# Patient Record
Sex: Female | Born: 1990 | Race: Black or African American | Hispanic: No | Marital: Single | Smoking: Never smoker
Health system: Southern US, Community
[De-identification: ages and names within clinical notes are randomized; demographics above are authoritative.]

## PROBLEM LIST (undated history)

## (undated) DIAGNOSIS — G43909 Migraine, unspecified, not intractable, without status migrainosus: Secondary | ICD-10-CM

## (undated) DIAGNOSIS — Z789 Other specified health status: Secondary | ICD-10-CM

## (undated) DIAGNOSIS — O139 Gestational [pregnancy-induced] hypertension without significant proteinuria, unspecified trimester: Secondary | ICD-10-CM

## (undated) HISTORY — DX: Migraine, unspecified, not intractable, without status migrainosus: G43.909

## (undated) HISTORY — DX: Gestational (pregnancy-induced) hypertension without significant proteinuria, unspecified trimester: O13.9

## (undated) HISTORY — PX: NO PAST SURGERIES: SHX2092

---

## 2014-02-11 ENCOUNTER — Encounter (INDEPENDENT_AMBULATORY_CARE_PROVIDER_SITE_OTHER): Payer: Medicaid Other | Admitting: Family Medicine

## 2014-02-11 NOTE — Progress Notes (Deleted)
Subjective:       Patient ID: Kaylee Lin is a 23 y.o. female.    HPI    {Common ambulatory SmartLinks:19316}    Review of Systems        Objective:    Physical Exam        Assessment:       ***      Plan:      Procedures    ***

## 2014-02-17 NOTE — Progress Notes (Signed)
PATIENT NO SHOW.

## 2014-11-19 ENCOUNTER — Encounter (INDEPENDENT_AMBULATORY_CARE_PROVIDER_SITE_OTHER): Payer: Self-pay | Admitting: Internal Medicine

## 2014-11-19 ENCOUNTER — Ambulatory Visit (INDEPENDENT_AMBULATORY_CARE_PROVIDER_SITE_OTHER): Payer: Medicaid Other | Admitting: Internal Medicine

## 2014-11-19 VITALS — BP 110/73 | HR 69 | Temp 98.2°F | Resp 16 | Ht 63.0 in | Wt 122.0 lb

## 2014-11-19 DIAGNOSIS — G43809 Other migraine, not intractable, without status migrainosus: Secondary | ICD-10-CM

## 2014-11-19 MED ORDER — KETOROLAC TROMETHAMINE 30 MG/ML IJ SOLN
30.0000 mg | Freq: Once | INTRAMUSCULAR | Status: AC
Start: 2014-11-19 — End: 2014-11-19
  Administered 2014-11-19: 30 mg via INTRAMUSCULAR

## 2014-11-19 MED ORDER — SUMATRIPTAN SUCCINATE 50 MG PO TABS
50.0000 mg | ORAL_TABLET | ORAL | Status: AC | PRN
Start: 2014-11-19 — End: ?

## 2014-11-19 NOTE — Patient Instructions (Signed)
Migraine Headache  Migraine headaches are caused by changes in blood flow to the brain. This causes throbbing or constant pain on one or both sides of your head. The pain may last from a few hours to several days. You usually will have nausea, vomiting, sensitivity to light and sound, and blurred vision. A migraine may be triggered by emotional stressor depression, or by hormone changes during the menstrual cycle. Other triggers include birth control pills, overuse of migraine medicines, alcohol or caffeine, foods with tyramine, eye strain, weather changes, missed meals,or too little or too much sleep.  Home care  Follow these tips when taking care of yourself at home:   Don't drive yourself home if you were given pain medicine for your headache. Instead, have someone else drive you home. Try to sleep when you get home. You should feel much better when you wake up.   Cold can help ease migraine symptoms. Put an ice pack on your forehead or at the base of your skull. Put heat on the back of your neck to help ease any neck spasm.   Drink only clear liquids or eat a light diet until your symptoms get better. This will help you avoid nausea and vomiting.  How to prevent migraines  Pay attention to what seems to trigger your headache. Try to avoid the triggers when you can. If you have frequent headaches, consider keeping a headache diary. In it, write down what you were doing, feeling, or eating in the hours before each headache. Show this to your health care provider to help find the cause of your headaches.  If stress seems to be a trigger for your headaches, figure out what is causing stress in your life. Learn new ways to handle your stress. Ideas include regular exercise, biofeedback, self-hypnosis, and meditation. Talk with your health care provider to find out more information about managing stress. Many books and digital media are also available on this subject.  Tyramine is a substance found in many  foods. It can trigger a migraine in some people. These foods contain tyramine:   Chocolate   Yogurt   All cheeses but cottage cheese and cream cheese   Smoked or pickled fish and meat, including herring, caviar, bologna, pepperoni, and salami   Liver   Avocados   Bananas   Figs   Raisins   Red wine  Try staying away from these foods for 1 to 2 months to see if you have fewer headaches.  How to treat future headaches   Take time out at the first sign of a headache, if possible. Find a quiet, dark, comfortable place to sit or lie down. Let yourself relax or sleep.   Put an ice pack on your forehead or on the area of greatest pain. A heating pad and massage may help if you are having a muscle spasm and tightness in your neck.   If you have been prescribed a medicine to stop a migraine headache, use this at the first warning sign of the headache for best results. First signs may be an aura or pain.   If you need to take medicine often for your migraine, talk with your health care provider about other ways to prevent your headaches.  Follow-up care  Follow up with your health care provider if your headache doesn't get better within the next 24 hours. Talk with your provider if you have frequent headaches. He or she can figure out a treatment plan. Ask if   you can have medicine to take at home the next time you get a bad headache. This may keep you from having to visit the emergency department in the future. You may need to see a headache specialist (neurologist) if you continue to have headaches.  When to seek medical advice  Call your health care provider right awayif any of these occur:   Your head pain gets worse, or doesn't get better within 24 hours   You can't keep liquids down (repeated vomiting)   Pain in your sinuses, ears, or throat   Fever of 100.4 F (38 C) or higher, or as directed by your health care provider   Stiff neck   Extreme drowsiness, confusion, or fainting   Dizziness, or  dizziness with spinning sensation (vertigo)   Weakness in an arm or leg, or on one side of your face   Difficulty talking or seeing     2000-2015 [employer] [address], [place] [address]. All rights reserved. This information is not intended as a substitute for professional medical care. Always follow your healthcare professional's instructions.

## 2014-11-19 NOTE — Progress Notes (Signed)
Subjective:       Patient ID: Kaylee Lin is a 24 y.o. female.    HPI    24 year old female presents with migraine headaches x 3 days.  The patient states that she has nausea, photophobia, and pounding pain in her head.  The patient has been worked up extensively by neurology, with no significant findings.  The patient has never taken imitrex in the past.  She has taken a caffeine pill with no improvement of her symptoms.  She feels that she needs pain medications to quell her symptoms.     Review of Systems        Objective:     Physical Exam   Nursing note and vitals reviewed.  Constitutional: She appears well-developed.   HENT:   Right Ear: External ear normal.   Left Ear: External ear normal.   Mouth/Throat: Oropharynx is clear and moist. No oropharyngeal exudate.   Eyes: Conjunctivae and EOM are normal. Right eye exhibits no discharge. Left eye exhibits no discharge.   Neck: Normal range of motion.   Cardiovascular: Normal rate and regular rhythm.    No murmur heard.  Pulmonary/Chest: Effort normal and breath sounds normal. She has no wheezes.           Assessment:       Patient with migraines       Plan:       The patient was given an injection of toradol, which she states helped a little.  The patient will be sent home with imitrex for the headaches.  She is to follow up with her neurologist.

## 2016-06-01 ENCOUNTER — Encounter (HOSPITAL_COMMUNITY): Payer: Self-pay | Admitting: Emergency Medicine

## 2016-06-01 ENCOUNTER — Ambulatory Visit (HOSPITAL_COMMUNITY)
Admission: EM | Admit: 2016-06-01 | Discharge: 2016-06-01 | Disposition: A | Discharge status: Home / Self Care | Payer: Medicaid Other | Attending: Emergency Medicine | Admitting: Emergency Medicine

## 2016-06-01 DIAGNOSIS — H9201 Otalgia, right ear: Secondary | ICD-10-CM | POA: Diagnosis not present

## 2016-06-01 DIAGNOSIS — H6981 Other specified disorders of Eustachian tube, right ear: Secondary | ICD-10-CM | POA: Diagnosis not present

## 2016-06-01 DIAGNOSIS — J00 Acute nasopharyngitis [common cold]: Secondary | ICD-10-CM

## 2016-06-01 MED ORDER — IPRATROPIUM BROMIDE 0.06 % NA SOLN: 2.0000 | Freq: Four times a day (QID) | NASAL | 0 refills | Status: DC

## 2016-06-01 MED ORDER — NAPROXEN 500 MG PO TABS: 500.0000 mg | ORAL_TABLET | Freq: Two times a day (BID) | ORAL | 0 refills | Status: DC

## 2016-06-01 MED ORDER — AMOXICILLIN 875 MG PO TABS: 875.0000 mg | ORAL_TABLET | Freq: Two times a day (BID) | ORAL | 0 refills | Status: DC

## 2016-06-01 NOTE — ED Triage Notes (Signed)
[facility] a [facility] of right side ear and face pain x 2 weeks. The patient stated that she believed that she was cutting a wisdom tooth.

## 2016-06-01 NOTE — ED Provider Notes (Signed)
CSN: [id number]     Arrival date & time 06/01/16  1723 History   First MD Initiated Contact with Patient 06/01/16 1832     Chief Complaint  Patient presents with  . Otalgia   (Consider location/radiation/quality/duration/timing/severity/associated sxs/prior Treatment) Patient c/o right ear and face discomfort for last few weeks.  Patient has been having URI sx's and sore throat and sinus congestion.   The history is provided by the patient.  Otalgia  Location:  Right Behind ear:  No abnormality Quality:  Aching Severity:  Moderate Onset quality:  Gradual Duration:  2 weeks Timing:  Constant Progression:  Worsening Chronicity:  New Relieved by:  None tried Worsened by:  Nothing Associated symptoms: congestion, rhinorrhea and sore throat     History reviewed. No pertinent past medical history. History reviewed. No pertinent surgical history. History reviewed. No pertinent family history. Social History  Substance Use Topics  . Smoking status: Current Some Day Smoker    Types: Cigarettes  . Smokeless tobacco: Never Used  . Alcohol use No   OB History    No data available     Review of Systems  Constitutional: Positive for fatigue.  HENT: Positive for congestion, ear pain, rhinorrhea and sore throat.   Eyes: Negative.   Respiratory: Negative.   Cardiovascular: Negative.   Gastrointestinal: Negative.   Endocrine: Negative.   Genitourinary: Negative.   Musculoskeletal: Negative.   Allergic/Immunologic: Negative.   Neurological: Negative.   Hematological: Negative.   Psychiatric/Behavioral: Negative.     Allergies  Codeine  Home Medications   Prior to Admission medications   Medication Sig Start Date End Date Taking? Authorizing Provider  amoxicillin (AMOXIL) 875 MG tablet Take 1 tablet (875 mg total) by mouth 2 (two) times daily. 06/01/16   Deatra CanterWilliam J Tashala Cumbo, FNP  ipratropium (ATROVENT) 0.06 % nasal spray Place 2 sprays into both nostrils 4 (four) times daily.  06/01/16   Deatra CanterWilliam J Elford Evilsizer, FNP  naproxen (NAPROSYN) 500 MG tablet Take 1 tablet (500 mg total) by mouth 2 (two) times daily with a meal. 06/01/16   Deatra CanterWilliam J Dayle Mcnerney, FNP   Meds Ordered and Administered this Visit  Medications - No data to display  BP 110/82 (BP Location: Left Arm)   Pulse 69   Temp 98.1 F (36.7 C) (Oral)   Resp 16   LMP 06/01/2016 (Exact Date)   SpO2 100%  No data found.   Physical Exam  Constitutional: She appears well-developed and well-nourished.  HENT:  Head: Normocephalic and atraumatic.  Right Ear: External ear normal.  Left Ear: External ear normal.  Mouth/Throat: Oropharynx is clear and moist.  Eyes: Conjunctivae and EOM are normal. Pupils are equal, round, and reactive to light.  Neck: Normal range of motion. Neck supple.  Cardiovascular: Normal rate, regular rhythm and normal heart sounds.   Pulmonary/Chest: Effort normal and breath sounds normal.  Abdominal: Soft.  Nursing note and vitals reviewed.   Urgent Care Course   Clinical Course     Procedures (including critical care time)  Labs Review Labs Reviewed - No data to display  Imaging Review No results found.   Visual Acuity Review  Right Eye Distance:   Left Eye Distance:   Bilateral Distance:    Right Eye Near:   Left Eye Near:    Bilateral Near:         MDM   1. Acute nasopharyngitis   2. Otalgia of right ear   3. ETD (Eustachian tube dysfunction), right  Ipratropium nasal spray Amoxicillin 875mg  one po bid x 7 days  Naprosyn 500mg one po bid #14    Deatra Canter, FNP 06/01/16 1858    Deatra Canter, FNP 06/01/16 1858

## 2017-02-23 ENCOUNTER — Encounter: Payer: Self-pay | Admitting: Obstetrics & Gynecology

## 2017-03-27 ENCOUNTER — Emergency Department (HOSPITAL_COMMUNITY)
Admission: EM | Admit: 2017-03-27 | Discharge: 2017-03-27 | Disposition: A | Discharge status: Home / Self Care | Payer: No Typology Code available for payment source | Attending: Emergency Medicine | Admitting: Emergency Medicine

## 2017-03-27 ENCOUNTER — Encounter (HOSPITAL_COMMUNITY): Payer: Self-pay | Admitting: Emergency Medicine

## 2017-03-27 ENCOUNTER — Emergency Department (HOSPITAL_COMMUNITY): Payer: No Typology Code available for payment source

## 2017-03-27 DIAGNOSIS — S76211A Strain of adductor muscle, fascia and tendon of right thigh, initial encounter: Secondary | ICD-10-CM | POA: Insufficient documentation

## 2017-03-27 DIAGNOSIS — F17210 Nicotine dependence, cigarettes, uncomplicated: Secondary | ICD-10-CM | POA: Diagnosis not present

## 2017-03-27 DIAGNOSIS — S29012A Strain of muscle and tendon of back wall of thorax, initial encounter: Secondary | ICD-10-CM | POA: Diagnosis not present

## 2017-03-27 DIAGNOSIS — Y9389 Activity, other specified: Secondary | ICD-10-CM | POA: Insufficient documentation

## 2017-03-27 DIAGNOSIS — S46819A Strain of other muscles, fascia and tendons at shoulder and upper arm level, unspecified arm, initial encounter: Secondary | ICD-10-CM

## 2017-03-27 DIAGNOSIS — Z79899 Other long term (current) drug therapy: Secondary | ICD-10-CM | POA: Diagnosis not present

## 2017-03-27 DIAGNOSIS — S39012A Strain of muscle, fascia and tendon of lower back, initial encounter: Secondary | ICD-10-CM

## 2017-03-27 DIAGNOSIS — Y999 Unspecified external cause status: Secondary | ICD-10-CM | POA: Diagnosis not present

## 2017-03-27 DIAGNOSIS — S1980XA Other specified injuries of unspecified part of neck, initial encounter: Secondary | ICD-10-CM | POA: Diagnosis present

## 2017-03-27 DIAGNOSIS — Y92410 Unspecified street and highway as the place of occurrence of the external cause: Secondary | ICD-10-CM | POA: Insufficient documentation

## 2017-03-27 DIAGNOSIS — S161XXA Strain of muscle, fascia and tendon at neck level, initial encounter: Secondary | ICD-10-CM | POA: Diagnosis not present

## 2017-03-27 MED ORDER — ACETAMINOPHEN 325 MG PO TABS
650.0000 mg | ORAL_TABLET | Freq: Once | ORAL | Status: DC
  Filled 2017-03-27: qty 2

## 2017-03-27 MED ORDER — NAPROXEN 500 MG PO TABS: 500.0000 mg | ORAL_TABLET | Freq: Two times a day (BID) | ORAL | 0 refills | Status: DC
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
## 2017-03-27 NOTE — ED Notes (Signed)
Called x1. No answer.

## 2019-12-09 ENCOUNTER — Emergency Department (HOSPITAL_COMMUNITY)
Admission: EM | Admit: 2019-12-09 | Discharge: 2019-12-09 | Disposition: A | Discharge status: Home / Self Care | Payer: Self-pay | Attending: Emergency Medicine | Admitting: Emergency Medicine

## 2019-12-09 ENCOUNTER — Encounter (HOSPITAL_COMMUNITY): Payer: Self-pay

## 2019-12-09 DIAGNOSIS — O209 Hemorrhage in early pregnancy, unspecified: Secondary | ICD-10-CM | POA: Insufficient documentation

## 2019-12-09 DIAGNOSIS — Z3A08 8 weeks gestation of pregnancy: Secondary | ICD-10-CM | POA: Insufficient documentation

## 2019-12-09 LAB — URINALYSIS, ROUTINE W REFLEX MICROSCOPIC
Bilirubin Urine: NEGATIVE
Glucose, UA: NEGATIVE mg/dL
Ketones, ur: NEGATIVE mg/dL
Leukocytes,Ua: NEGATIVE
Nitrite: NEGATIVE
Protein, ur: NEGATIVE mg/dL
RBC / HPF: >50 RBC/hpf — ABNORMAL HIGH (ref 0–5)
Specific Gravity, Urine: 1.012 (ref 1.005–1.030)
pH: 7 (ref 5.0–8.0)

## 2019-12-09 LAB — BASIC METABOLIC PANEL
Anion gap: 8 (ref 5–15)
BUN: 6 mg/dL (ref 6–20)
CO2: 26 mmol/L (ref 22–32)
Calcium: 9.1 mg/dL (ref 8.9–10.3)
Chloride: 105 mmol/L (ref 98–111)
Creatinine, Ser: 0.76 mg/dL (ref 0.44–1.00)
GFR calc Af Amer: >60 mL/min (ref >60)
GFR calc non Af Amer: >60 mL/min (ref >60)
Glucose, Bld: 87 mg/dL (ref 70–99)
Potassium: 4 mmol/L (ref 3.5–5.1)
Sodium: 139 mmol/L (ref 135–145)

## 2019-12-09 LAB — CBC WITH DIFFERENTIAL/PLATELET
Abs Immature Granulocytes: 0.03 10*3/uL (ref 0.00–0.07)
Basophils Absolute: 0.1 10*3/uL (ref 0.0–0.1)
Basophils Relative: 1 %
Eosinophils Absolute: 0.1 10*3/uL (ref 0.0–0.5)
Eosinophils Relative: 1 %
HCT: 40.7 % (ref 36.0–46.0)
Hemoglobin: 13.2 g/dL (ref 12.0–15.0)
Immature Granulocytes: 0 %
Lymphocytes Relative: 31 %
Lymphs Abs: 2.6 10*3/uL (ref 0.7–4.0)
MCH: 29.9 pg (ref 26.0–34.0)
MCHC: 32.4 g/dL (ref 30.0–36.0)
MCV: 92.3 fL (ref 80.0–100.0)
Monocytes Absolute: 0.6 10*3/uL (ref 0.1–1.0)
Monocytes Relative: 7 %
Neutro Abs: 5.1 10*3/uL (ref 1.7–7.7)
Neutrophils Relative %: 60 %
Platelets: 309 10*3/uL (ref 150–400)
RBC: 4.41 MIL/uL (ref 3.87–5.11)
RDW: 12.3 % (ref 11.5–15.5)
WBC: 8.5 10*3/uL (ref 4.0–10.5)
nRBC: 0 % (ref 0.0–0.2)

## 2019-12-09 LAB — POC URINE PREG, ED: Preg Test, Ur: NEGATIVE

## 2019-12-09 LAB — I-STAT BETA HCG BLOOD, ED (MC, WL, AP ONLY): I-stat hCG, quantitative: 7.1 m[IU]/mL — ABNORMAL HIGH (ref <5)

## 2019-12-09 NOTE — ED Provider Notes (Signed)
Patient placed in Quick Look pathway, seen and evaluated   Chief Complaint: vaginal bleeding in pregnancy  HPI:   Patient is G5P3013 at approx [redacted] weeks EGA. Has had some vaginal bleeding, no sig pain. LMP 11/03/2019  ROS: vaginal bleeding (one)  Physical Exam:   Gen: No distress  Neuro: Awake and Alert  Skin: Warm    Focused Exam: No abdominal pain   Urine test negative. I have ordered labs including an Istat B hcg Initiation of care has begun. The patient has been counseled on the process, plan, and necessity for staying for the completion/evaluation, and the remainder of the medical screening examination    Arthor Captain, Cordelia Poche 12/10/19 1614    Gerhard Munch, MD 12/11/19 2348

## 2019-12-09 NOTE — ED Triage Notes (Signed)
Pt reports bright red vaginal bleeding since this morning. Pt about [redacted] weeks pregnant, seen at an outpt clinic to confirm. Pt has changed her pad about 3 times today, some abd cramping

## 2020-04-19 ENCOUNTER — Encounter (HOSPITAL_COMMUNITY): Payer: Self-pay

## 2020-04-19 ENCOUNTER — Other Ambulatory Visit: Payer: Self-pay

## 2020-04-19 ENCOUNTER — Ambulatory Visit (HOSPITAL_COMMUNITY)
Admission: EM | Admit: 2020-04-19 | Discharge: 2020-04-19 | Disposition: A | Discharge status: Home / Self Care | Payer: Self-pay | Attending: Family Medicine | Admitting: Family Medicine

## 2020-04-19 DIAGNOSIS — T148XXA Other injury of unspecified body region, initial encounter: Secondary | ICD-10-CM

## 2020-04-19 DIAGNOSIS — M5441 Lumbago with sciatica, right side: Secondary | ICD-10-CM

## 2020-04-19 MED ORDER — CYCLOBENZAPRINE HCL 10 MG PO TABS: 10.0000 mg | ORAL_TABLET | Freq: Two times a day (BID) | ORAL | 0 refills | Status: DC | PRN

## 2020-04-19 MED ORDER — DEXAMETHASONE SODIUM PHOSPHATE 10 MG/ML IJ SOLN
10.0000 mg | Freq: Once | INTRAMUSCULAR | Status: AC
  Administered 2020-04-19: 10 mg via INTRAMUSCULAR

## 2020-04-19 MED ORDER — KETOROLAC TROMETHAMINE 30 MG/ML IJ SOLN
30.0000 mg | Freq: Once | INTRAMUSCULAR | Status: AC
  Administered 2020-04-19: 30 mg via INTRAMUSCULAR

## 2020-04-19 MED ORDER — KETOROLAC TROMETHAMINE 30 MG/ML IJ SOLN
INTRAMUSCULAR | Status: AC
  Filled 2020-04-19: qty 1

## 2020-04-19 MED ORDER — IBUPROFEN 800 MG PO TABS: 800.0000 mg | ORAL_TABLET | Freq: Three times a day (TID) | ORAL | 0 refills | Status: DC | PRN

## 2020-04-19 MED ORDER — PREDNISONE 10 MG (21) PO TBPK: ORAL_TABLET | Freq: Every day | ORAL | 0 refills | Status: AC

## 2020-04-19 MED ORDER — DEXAMETHASONE SODIUM PHOSPHATE 10 MG/ML IJ SOLN
INTRAMUSCULAR | Status: AC
  Filled 2020-04-19: qty 1

## 2020-04-19 NOTE — Discharge Instructions (Addendum)
You have received a steroid injection as well as an injection for pain in the office today  Take ibuprofen as needed for your pain.    Take the muscle relaxer Flexeril as needed for muscle spasm; Do not drive, operate machinery, or drink alcohol with this medication as it may make you drowsy.    I have sent in a prednisone taper for you to take for 6 days. 6 tablets on day one, 5 tablets on day two, 4 tablets on day three, 3 tablets on day four, 2 tablets on day five, and 1 tablet on day six.  Follow up with your primary care provider or an orthopedist if your pain is not improving.

## 2020-04-19 NOTE — ED Triage Notes (Signed)
Pt c/o pain to right buttocks radiating through posterior right thigh that increases with bending over/movement. Pt had a massage yesterday that worsened pain/symptoms.  Denies any known trauma/injury to back/leg. Took 800mg  ibuprofen yesterday w/o improvement

## 2020-04-19 NOTE — ED Provider Notes (Signed)
[facility]   [id number] 04/19/20 Arrival Time: 1003  XQ:JJHER PAIN  SUBJECTIVE: History from: patient. Bailey Blair is a 29 y.o. female complains of right buttock, hip and thigh pain x 4 days. Denies a precipitating event or specific injury. Describes the pain as intermittent and achy in character. Has tried OTC medications without relief. Works as a Conservation officer, nature and states that the pain is the worst when she is there. The pain has been interrupting her sleep as well. Symptoms are made worse with activity.  Denies similar symptoms in the past. Denies fever, chills, erythema, ecchymosis, effusion, weakness, numbness and tingling, saddle paresthesias, loss of bowel or bladder function.      ROS: As per HPI.  All other pertinent ROS negative.     History reviewed. No pertinent past medical history. History reviewed. No pertinent surgical history. Allergies  Allergen Reactions  . Codeine Swelling   No current facility-administered medications on file prior to encounter.   Current Outpatient Medications on File Prior to Encounter  Medication Sig Dispense Refill  . amoxicillin (AMOXIL) 875 MG tablet Take 1 tablet (875 mg total) by mouth 2 (two) times daily. 14 tablet 0  . ipratropium (ATROVENT) 0.06 % nasal spray Place 2 sprays into both nostrils 4 (four) times daily. 15 mL 0  . naproxen (NAPROSYN) 500 MG tablet Take 1 tablet (500 mg total) by mouth 2 (two) times daily with a meal. 14 tablet 0   Social History   Socioeconomic History  . Marital status: Single    Spouse name: Not on file  . Number of children: Not on file  . Years of education: Not on file  . Highest education level: Not on file  Occupational History  . Not on file  Tobacco Use  . Smoking status: Current Some Day Smoker    Types: Cigarettes  . Smokeless tobacco: Never Used  Substance and Sexual Activity  . Alcohol use: Yes  . Drug use: Yes    Types: Marijuana  . Sexual activity: Yes    Birth  control/protection: None  Other Topics Concern  . Not on file  Social History Narrative  . Not on file   Social Determinants of Health   Financial Resource Strain:   . Difficulty of Paying Living Expenses: Not on file  Food Insecurity:   . Worried About Programme researcher, broadcasting/film/video in the Last Year: Not on file  . Ran Out of Food in the Last Year: Not on file  Transportation Needs:   . Lack of Transportation (Medical): Not on file  . Lack of Transportation (Non-Medical): Not on file  Physical Activity:   . Days of Exercise per Week: Not on file  . Minutes of Exercise per Session: Not on file  Stress:   . Feeling of Stress : Not on file  Social Connections:   . Frequency of Communication with Friends and Family: Not on file  . Frequency of Social Gatherings with Friends and Family: Not on file  . Attends Religious Services: Not on file  . Active Member of Clubs or Organizations: Not on file  . Attends Banker Meetings: Not on file  . Marital Status: Not on file  Intimate Partner Violence:   . Fear of Current or Ex-Partner: Not on file  . Emotionally Abused: Not on file  . Physically Abused: Not on file  . Sexually Abused: Not on file   History reviewed. No pertinent family history.  OBJECTIVE:  Vitals:  04/19/20 1027  BP: 115/82  Pulse: 81  Resp: 17  Temp: 98.3 F (36.8 C)  TempSrc: Oral  SpO2: 100%    General appearance: ALERT; in no acute distress.  Head: NCAT Lungs: Normal respiratory effort CV: pulses 2+ bilaterally. Cap refill < 2 seconds Musculoskeletal:  Inspection: Skin warm, dry, clear and intact without obvious erythema, effusion, or ecchymosis.  Palpation: R low back tender to palpation ROM: limited ROM active and passive for bending, twisting, position changes Skin: warm and dry Neurologic: Ambulates without difficulty; Sensation intact about the upper/ lower extremities Psychological: alert and cooperative; normal mood and  affect  DIAGNOSTIC STUDIES:  No results found.   ASSESSMENT & PLAN:  1. Acute right-sided low back pain with right-sided sciatica   2. Muscle strain     Meds ordered this encounter  Medications  . predniSONE (STERAPRED UNI-PAK 21 TAB) 10 MG (21) TBPK tablet    Sig: Take by mouth daily for 6 days. Take 6 tablets on day 1, 5 tablets on day 2, 4 tablets on day 3, 3 tablets on day 4, 2 tablets on day 5, 1 tablet on day 6    Dispense:  21 tablet    Refill:  0    Order Specific Question:   Supervising Provider    Answer:   Merrilee Jansky [id number]  . cyclobenzaprine (FLEXERIL) 10 MG tablet    Sig: Take 1 tablet (10 mg total) by mouth 2 (two) times daily as needed for muscle spasms.    Dispense:  20 tablet    Refill:  0    Order Specific Question:   Supervising Provider    Answer:   Merrilee Jansky [id number]  . ibuprofen (ADVIL) 800 MG tablet    Sig: Take 1 tablet (800 mg total) by mouth every 8 (eight) hours as needed for moderate pain.    Dispense:  21 tablet    Refill:  0    Order Specific Question:   Supervising Provider    Answer:   Merrilee Jansky [id number]  . dexamethasone (DECADRON) injection 10 mg  . ketorolac (TORADOL) 30 MG/ML injection 30 mg   Decadron 10mg  IM in office today Toradol 30mg  IM in office today Prescribed steroid taper Prescribed ibuprofen Prescribed cyclobenzaprine Work note provided Continue conservative management of rest, ice, and gentle stretches Take ibuprofen as needed for pain relief (may cause abdominal discomfort, ulcers, and GI bleeds avoid taking with other NSAIDs) Take cyclobenzaprine at nighttime for symptomatic relief. Avoid driving or operating heavy machinery while using medication. Follow up with PCP if symptoms persist Return or go to the ER if you have any new or worsening symptoms (fever, chills, chest pain, abdominal pain, changes in bowel or bladder habits, pain radiating into lower legs)   Reviewed expectations re: course  of current medical issues. Questions answered. Outlined signs and symptoms indicating need for more acute intervention. Patient verbalized understanding. After Visit Summary given.       , NP 04/19/20 1048

## 2020-05-30 NOTE — L&D Delivery Note (Signed)
Delivery Note Bailey Blair is a [id number] at [redacted]w[redacted]d who had a spontaneous delivery at 2050 a viable female [facility]") was delivered via ROA.  APGAR: 8, 9; weight 5lb 15.3oz  .     Admitted for term IOL for history of IUFD. Induced with cytotec, pitocin, Cook catheters, AROM. Progressed quickly once in active labor. Received epidural for pain management. Pushed for 1 minute. Baby was delivered without difficulty. No nuchal cord.  Delayed cord clamping for 60 seconds. Delivery of placenta was spontaneous. Placenta was found to be intact, 3 -vessel cord was noted. The fundus was found to be firm. Perineum intact. No lacerations.  Estimated blood loss 100cc. Instrument and gauze counts were correct at the end of the procedure.   Placenta status: to L&D for disposal  Anesthesia:  epidural Episiotomy:  none Lacerations:  none Suture Repair: n/a Est. Blood Loss (mL):  100  Mom to postpartum.  Baby to Couplet care / Skin to Skin.  [name] 01/17/2021, 9:08 PM

## 2020-06-08 ENCOUNTER — Other Ambulatory Visit: Payer: Self-pay

## 2020-06-08 ENCOUNTER — Inpatient Hospital Stay (HOSPITAL_COMMUNITY)
Admission: AD | Admit: 2020-06-08 | Discharge: 2020-06-09 | Disposition: A | Discharge status: Home / Self Care | Payer: Medicaid Other | Attending: Obstetrics | Admitting: Obstetrics

## 2020-06-08 DIAGNOSIS — F17210 Nicotine dependence, cigarettes, uncomplicated: Secondary | ICD-10-CM | POA: Insufficient documentation

## 2020-06-08 DIAGNOSIS — O219 Vomiting of pregnancy, unspecified: Secondary | ICD-10-CM | POA: Insufficient documentation

## 2020-06-08 DIAGNOSIS — Z3A01 Less than 8 weeks gestation of pregnancy: Secondary | ICD-10-CM | POA: Insufficient documentation

## 2020-06-08 DIAGNOSIS — O99331 Smoking (tobacco) complicating pregnancy, first trimester: Secondary | ICD-10-CM | POA: Insufficient documentation

## 2020-06-08 LAB — URINALYSIS, ROUTINE W REFLEX MICROSCOPIC
Bilirubin Urine: NEGATIVE
Glucose, UA: 50 mg/dL — AB
Hgb urine dipstick: NEGATIVE
Ketones, ur: 80 mg/dL — AB
Leukocytes,Ua: NEGATIVE
Nitrite: NEGATIVE
Protein, ur: 100 mg/dL — AB
Specific Gravity, Urine: 1.033 — ABNORMAL HIGH (ref 1.005–1.030)
pH: 6 (ref 5.0–8.0)

## 2020-06-08 MED ORDER — PROMETHAZINE HCL 25 MG/ML IJ SOLN
25.0000 mg | Freq: Once | INTRAMUSCULAR | Status: AC
  Administered 2020-06-09: 25 mg via INTRAVENOUS
  Filled 2020-06-08: qty 1

## 2020-06-08 MED ORDER — M.V.I. ADULT IV INJ
Freq: Once | INTRAVENOUS | Status: AC
  Filled 2020-06-08: qty 10

## 2020-06-08 MED ORDER — LACTATED RINGERS IV BOLUS
1000.0000 mL | Freq: Once | INTRAVENOUS | Status: AC
  Administered 2020-06-09: 1000 mL via INTRAVENOUS

## 2020-06-08 MED ORDER — FAMOTIDINE IN NACL 20-0.9 MG/50ML-% IV SOLN
20.0000 mg | Freq: Once | INTRAVENOUS | Status: AC
  Administered 2020-06-09: 20 mg via INTRAVENOUS
  Filled 2020-06-08: qty 50

## 2020-06-08 NOTE — MAU Note (Signed)
PT SAYS SHE HAS BEEN VOMITING -  1-2 WEEKS .  TOOK PHENERGAN   AT 7PM-  [facility] - WAS THERE TODAY AT 2 PM -  TOLD TO COME HERE IF NOT BETTER .

## 2020-06-08 NOTE — MAU Note (Incomplete)
Patient sent from [facility] at [redacted] weeks gestation with complaints of nausea and vomiting for 1 week.

## 2020-06-08 NOTE — MAU Note (Signed)
Patient sent from [facility] at [redacted] weeks gestation with complaints of nausea and vomiting for 1 week.   

## 2020-06-09 DIAGNOSIS — O99331 Smoking (tobacco) complicating pregnancy, first trimester: Secondary | ICD-10-CM | POA: Diagnosis not present

## 2020-06-09 DIAGNOSIS — Z3A01 Less than 8 weeks gestation of pregnancy: Secondary | ICD-10-CM

## 2020-06-09 DIAGNOSIS — O219 Vomiting of pregnancy, unspecified: Secondary | ICD-10-CM | POA: Diagnosis present

## 2020-06-09 DIAGNOSIS — O210 Mild hyperemesis gravidarum: Secondary | ICD-10-CM

## 2020-06-09 DIAGNOSIS — F17210 Nicotine dependence, cigarettes, uncomplicated: Secondary | ICD-10-CM | POA: Diagnosis not present

## 2020-06-09 LAB — PREGNANCY, URINE: Preg Test, Ur: POSITIVE — AB

## 2020-06-09 MED ORDER — ONDANSETRON 8 MG PO TBDP: 8.0000 mg | ORAL_TABLET | Freq: Three times a day (TID) | ORAL | 0 refills | Status: DC | PRN

## 2020-06-09 MED ORDER — DEXAMETHASONE SODIUM PHOSPHATE 10 MG/ML IJ SOLN: 10.0000 mg | Freq: Once | INTRAMUSCULAR | Status: DC

## 2020-06-09 MED ORDER — SCOPOLAMINE 1 MG/3DAYS TD PT72
1.0000 | MEDICATED_PATCH | TRANSDERMAL | Status: DC
  Administered 2020-06-09: 1.5 mg via TRANSDERMAL
  Filled 2020-06-09: qty 1

## 2020-06-09 MED ORDER — METOCLOPRAMIDE HCL 5 MG/ML IJ SOLN: 10.0000 mg | Freq: Once | INTRAMUSCULAR | Status: DC

## 2020-06-09 NOTE — Discharge Instructions (Signed)
Hyperemesis Gravidarum Hyperemesis gravidarum is a severe form of nausea and vomiting that happens during pregnancy. Hyperemesis is worse than morning sickness. It may cause you to have nausea or vomiting all day for many days. It may keep you from eating and drinking enough food and liquids, which can lead to dehydration, malnutrition, and weight loss. Hyperemesis usually occurs during the first half (the first 20 weeks) of pregnancy. It often goes away once a woman is in her second half of pregnancy. However, sometimes hyperemesis continues through an entire pregnancy. What are the causes? The cause of this condition is not known. It may be associated with:  Changes in hormones in the body during pregnancy.  Changes in the gastrointestinal system.  Genetic or inherited conditions. What are the signs or symptoms? Symptoms of this condition include:  Severe nausea and vomiting that does not go away.  Problems keeping food down.  Weight loss.  Loss of body fluid (dehydration).  Loss of appetite. You may have no desire to eat or you may not like the food you have previously enjoyed. How is this diagnosed? This condition may be diagnosed based on your medical history, your symptoms, and a physical exam. You may also have other tests, including:  Blood tests.  Urine tests.  Blood pressure tests.  Ultrasound to look for problems with the placenta or to check if you are pregnant with more than one baby. How is this treated? This condition is managed by controlling symptoms. This may include:  Following an eating plan. This can help to lessen nausea and vomiting.  Treatments that do not use medicine. These include acupressure bracelets, hypnosis, and eating or drinking foods or fluids that contain ginger, ginger ale, or ginger tea.  Taking prescription medicine or over-the-counter medicine as told by your health care provider.  Continuing to take prenatal vitamins. You may need to  change what kind you take and when you take them. Follow your health care provider's instructions about prenatal vitamins. An eating plan and medicines are often used together to help control symptoms. If medicines do not help relieve nausea and vomiting, you may need to receive fluids through an IV at the hospital. Follow these instructions at home: To help relieve your symptoms, listen to your body. Everyone is different and has different preferences. Find what works best for you. Here are some things you can try to help relieve your symptoms: Meals and snacks  Eat 5-6 small meals daily instead of 3 large meals. Eating small meals and snacks can help you avoid an empty stomach.  Before getting out of bed, eat a couple of crackers to avoid moving around on an empty stomach.  Eat a protein-rich snack before bed. Examples include cheese and crackers, or a peanut butter sandwich made with 1 slice of whole-wheat bread and 1 tsp (5 g) of peanut butter.  Eat and drink slowly.  Try eating starchy foods as these are usually tolerated well. Examples include cereal, toast, bread, potatoes, pasta, rice, and pretzels.  Eat at least one serving of protein with your meals and snacks. Protein options include lean meats, poultry, seafood, beans, nuts, nut butters, eggs, cheese, and yogurt.  Eat or suck on things that have ginger in them. It may help to relieve nausea. Add  tsp (0.44 g) ground ginger to hot tea, or choose ginger tea.   Fluids It is important to stay hydrated. Try to:  Drink small amounts of fluids often.  Drink fluids 30 minutes   before or after a meal to help lessen the feeling of a full stomach.  Drink 100% fruit juice or an electrolyte drink. An electrolyte drink contains sodium, potassium, and chloride.  Drink fluids that are cold, clear, and carbonated or sour. These include lemonade, ginger ale, lemon-lime soda, ice water, and sparkling water. Things to avoid Avoid the  following:  Eating foods that trigger your symptoms. These may include spicy foods, coffee, high-fat foods, very sweet foods, and acidic foods.  Drinking more than 1 cup of fluid at a time.  Skipping meals. Nausea can be more intense on an empty stomach. If you cannot tolerate food, do not force it. Try sucking on ice chips or other frozen items and make up for missed calories later.  Lying down within 2 hours after eating.  Being exposed to environmental triggers. These may include food smells, smoky rooms, closed spaces, rooms with strong smells, warm or humid places, overly loud and noisy rooms, and rooms with motion or flickering lights. Try eating meals in a well-ventilated area that is free of strong smells.  Making quick and sudden changes in your movement.  Taking iron pills and multivitamins that contain iron. If you take prescription iron pills, do not stop taking them unless your health care provider approves.  Preparing food. The smell of food can spoil your appetite or trigger nausea. General instructions  Brush your teeth or use a mouth rinse after meals.  Take over-the-counter and prescription medicines only as told by your health care provider.  Follow instructions from your health care provider about eating or drinking restrictions.  Talk with your health care provider about starting a supplement of vitamin B6.  Continue to take your prenatal vitamins as told by your health care provider. If you are having trouble taking your prenatal vitamins, talk with your health care provider about other options.  Keep all follow-up visits. This is important. Follow-up visits include prenatal visits. Contact a health care provider if:  You have pain in your abdomen.  You have a severe headache.  You have vision problems.  You are losing weight.  You feel weak or dizzy.  You cannot eat or drink without vomiting, especially if this goes on for a full day. Get help right  away if:  You cannot drink fluids without vomiting.  You vomit blood.  You have constant nausea and vomiting.  You are very weak.  You faint.  You have a fever and your symptoms suddenly get worse. Summary  Hyperemesis gravidarum is a severe form of nausea and vomiting that happens during pregnancy.  Making some changes to your eating habits may help relieve nausea and vomiting.  This condition may be managed with lifestyle changes and medicines as prescribed by your health care provider.  If medicines do not help relieve nausea and vomiting, you may need to receive fluids through an IV at the hospital. This information is not intended to replace advice given to you by your health care provider. Make sure you discuss any questions you have with your health care provider. Document Revised: 12/09/2019 Document Reviewed: 12/09/2019 Elsevier Patient Education  2021 Elsevier Inc.  

## 2020-06-09 NOTE — MAU Provider Note (Signed)
Chief Complaint:  Emesis   Event Date/Time   First Provider Initiated Contact with Patient 06/09/20 0223     HPI: Bailey Blair is a 30 y.o. G6P3 at [redacted]w[redacted]d who presents to maternity admissions reporting severe nausea and vomiting x2wks. For the past several days she has not been able to keep anything down including water. She was sent to MAU by her MD for rehydration and management of nausea. She has not vomited since arrival to MAU. Pt states nothing she's tried works for her nausea including promethazine and marijuana. The last time she tried MJ was three days ago. Denies vaginal bleeding or discharge, fever, falls, or recent illness.   No past medical history on file. OB History  Gravida Para Term Preterm AB Living  6 3       3   SAB IAB Ectopic Multiple Live Births          3    # Outcome Date GA Lbr Len/2nd Weight Sex Delivery Anes PTL Lv  6 Current           5 Gravida           4 Gravida           3 Para           2 Para           1 Para            No past surgical history on file. No family history on file. Social History   Tobacco Use  . Smoking status: Current Some Day Smoker    Types: Cigarettes  . Smokeless tobacco: Never Used  Substance Use Topics  . Alcohol use: Yes  . Drug use: Yes    Types: Marijuana   Allergies  Allergen Reactions  . Codeine Swelling   Medications Prior to Admission  Medication Sig Dispense Refill Last Dose  . Prenatal Vit-Fe Fumarate-FA (PRENATAL MULTIVITAMIN) TABS tablet Take 1 tablet by mouth daily at 12 noon.     amoxicillin (AMOXIL) 875 MG tablet Take 1 tablet (875 mg total) by mouth 2 (two) times daily. 14 tablet 0   . cyclobenzaprine (FLEXERIL) 10 MG tablet Take 1 tablet (10 mg total) by mouth 2 (two) times daily as needed for muscle spasms. 20 tablet 0   . ibuprofen (ADVIL) 800 MG tablet Take 1 tablet (800 mg total) by mouth every 8 (eight) hours as needed for moderate pain. 21 tablet 0   . ipratropium (ATROVENT) 0.06 % nasal  spray Place 2 sprays into both nostrils 4 (four) times daily. 15 mL 0   . naproxen (NAPROSYN) 500 MG tablet Take 1 tablet (500 mg total) by mouth 2 (two) times daily with a meal. 14 tablet 0    I have reviewed patient's Past Medical Hx, Surgical Hx, Family Hx, Social Hx, medications and allergies.   ROS:  Review of Systems  Constitutional: Positive for fatigue.  Gastrointestinal: Positive for nausea and vomiting.  Genitourinary: Negative for vaginal bleeding.  Neurological: Positive for weakness.  All other systems reviewed and are negative.   Physical Exam   Patient Vitals for the past 24 hrs:  BP Temp Temp src Pulse Resp Height Weight  06/09/20 0058 (!) 93/54 - - 76 18 - -  06/09/20 0000 105/63 98.4 F (36.9 C) Oral 77 16 - -  06/08/20 2212 116/85 98.7 F (37.1 C) Oral 78 20 5\' 3"  (1.6 m) 142 lb 12.8 oz (64.8 kg)   Constitutional:  Well-developed, well-nourished female Cardiovascular: normal rate & rhythm, no murmur Respiratory: normal effort, lung sounds clear throughout GI: Abd soft, non-tender, gravid appropriate for gestational age. Pos BS x 4 MS: Extremities nontender, no edema, normal ROM Neurologic: Alert and oriented x 4.  GU: no CVA tenderness Pelvic exam deferred  Labs: Results for orders placed or performed during the hospital encounter of 06/08/20 (from the past 24 hour(s))  Urinalysis, Routine w reflex microscopic Urine, Clean Catch     Status: Abnormal   Collection Time: 06/08/20 10:15 PM  Result Value Ref Range   Color, Urine AMBER (A) YELLOW   APPearance HAZY (A) CLEAR   Specific Gravity, Urine 1.033 (H) 1.005 - 1.030   pH 6.0 5.0 - 8.0   Glucose, UA 50 (A) NEGATIVE mg/dL   Hgb urine dipstick NEGATIVE NEGATIVE   Bilirubin Urine NEGATIVE NEGATIVE   Ketones, ur 80 (A) NEGATIVE mg/dL   Protein, ur 193 (A) NEGATIVE mg/dL   Nitrite NEGATIVE NEGATIVE   Leukocytes,Ua NEGATIVE NEGATIVE   RBC / HPF 6-10 0 - 5 RBC/hpf   WBC, UA 6-10 0 - 5 WBC/hpf   Bacteria,  UA RARE (A) NONE SEEN   Squamous Epithelial / LPF 11-20 0 - 5   Mucus PRESENT   Pregnancy, urine     Status: Abnormal   Collection Time: 06/09/20 12:10 AM  Result Value Ref Range   Preg Test, Ur POSITIVE (A) NEGATIVE   Imaging:  No results found.  MAU Course: Orders Placed This Encounter  Procedures  . Urinalysis, Routine w reflex microscopic Urine, Clean Catch  . Pregnancy, urine  . Discharge patient   Meds ordered this encounter  Medications  . lactated ringers bolus 1,000 mL  . famotidine (PEPCID) IVPB 20 mg premix  . promethazine (PHENERGAN) injection 25 mg  . multivitamins adult (INFUVITE ADULT) 10 mL in lactated ringers 1,000 mL infusion  . DISCONTD: dexamethasone (DECADRON) injection 10 mg  . DISCONTD: metoCLOPramide (REGLAN) injection 10 mg  . scopolamine (TRANSDERM-SCOP) 1 MG/3DAYS 1.5 mg  . ondansetron (ZOFRAN ODT) 8 MG disintegrating tablet    Sig: Take 1 tablet (8 mg total) by mouth every 8 (eight) hours as needed for nausea or vomiting. Begin using once patch is taken off on 06/12/20    Dispense:  20 tablet    Refill:  0    Order Specific Question:   Supervising Provider    Answer:   Reva Bores [[id number]]    MDM: LR bolus + phenergan and pepcid IV given with little relief of nausea MV given and tolerated well  Pt still nauseated, unable to tolerate sips (but no vomiting). Ordered additional IV meds but IV no longer viable and pt declined a restart. Scopolamine patch applied, zofran sent to her pharmacy  Pt counseled on use of marijuana in pregnancy making N/V worse.  Assessment: 1. Nausea and vomiting during pregnancy prior to [redacted] weeks gestation    Plan: Discharge home in stable condition with return precautions.    Follow-up Information    Ob/Gyn, Nest[facility]amp. Go to.   Why: as scheduled for ongoing prenatal care Contact information: [address] [place] [address] [phone]               Allergies as of 06/09/2020       Reactions   Codeine Swelling      Medication List    TAKE these medications   amoxicillin 875 MG tablet Commonly known as: AMOXIL Take  1 tablet (875 mg total) by mouth 2 (two) times daily.   cyclobenzaprine 10 MG tablet Commonly known as: FLEXERIL Take 1 tablet (10 mg total) by mouth 2 (two) times daily as needed for muscle spasms.   ibuprofen 800 MG tablet Commonly known as: ADVIL Take 1 tablet (800 mg total) by mouth every 8 (eight) hours as needed for moderate pain.   ipratropium 0.06 % nasal spray Commonly known as: ATROVENT Place 2 sprays into both nostrils 4 (four) times daily.   naproxen 500 MG tablet Commonly known as: NAPROSYN Take 1 tablet (500 mg total) by mouth 2 (two) times daily with a meal.   ondansetron 8 MG disintegrating tablet Commonly known as: Zofran ODT Take 1 tablet (8 mg total) by mouth every 8 (eight) hours as needed for nausea or vomiting. Begin using once patch is taken off on 06/12/20   prenatal multivitamin Tabs tablet Take 1 tablet by mouth daily at 12 noon.      Edd Arbour, CNM, MSN, [facility] 06/09/20 2:36 AM

## 2020-06-16 ENCOUNTER — Encounter (HOSPITAL_COMMUNITY): Payer: Self-pay | Admitting: Obstetrics and Gynecology

## 2020-06-16 ENCOUNTER — Inpatient Hospital Stay (HOSPITAL_COMMUNITY)
Admission: AD | Admit: 2020-06-16 | Discharge: 2020-06-17 | Disposition: A | Discharge status: Home / Self Care | Payer: Medicaid Other | Attending: Obstetrics and Gynecology | Admitting: Obstetrics and Gynecology

## 2020-06-16 ENCOUNTER — Other Ambulatory Visit: Payer: Self-pay

## 2020-06-16 DIAGNOSIS — O219 Vomiting of pregnancy, unspecified: Secondary | ICD-10-CM

## 2020-06-16 DIAGNOSIS — O99331 Smoking (tobacco) complicating pregnancy, first trimester: Secondary | ICD-10-CM | POA: Diagnosis not present

## 2020-06-16 DIAGNOSIS — E860 Dehydration: Secondary | ICD-10-CM | POA: Diagnosis not present

## 2020-06-16 DIAGNOSIS — Z885 Allergy status to narcotic agent status: Secondary | ICD-10-CM | POA: Diagnosis not present

## 2020-06-16 DIAGNOSIS — O211 Hyperemesis gravidarum with metabolic disturbance: Secondary | ICD-10-CM | POA: Diagnosis not present

## 2020-06-16 DIAGNOSIS — Z3A08 8 weeks gestation of pregnancy: Secondary | ICD-10-CM | POA: Insufficient documentation

## 2020-06-16 DIAGNOSIS — O210 Mild hyperemesis gravidarum: Secondary | ICD-10-CM | POA: Diagnosis present

## 2020-06-16 DIAGNOSIS — F17210 Nicotine dependence, cigarettes, uncomplicated: Secondary | ICD-10-CM | POA: Insufficient documentation

## 2020-06-16 HISTORY — DX: Other specified health status: Z78.9

## 2020-06-16 LAB — URINALYSIS, ROUTINE W REFLEX MICROSCOPIC
Bilirubin Urine: NEGATIVE
Glucose, UA: NEGATIVE mg/dL
Hgb urine dipstick: NEGATIVE
Ketones, ur: 20 mg/dL — AB
Leukocytes,Ua: NEGATIVE
Nitrite: NEGATIVE
Protein, ur: 30 mg/dL — AB
Specific Gravity, Urine: 1.034 — ABNORMAL HIGH (ref 1.005–1.030)
pH: 5 (ref 5.0–8.0)

## 2020-06-16 MED ORDER — LACTATED RINGERS IV BOLUS
1000.0000 mL | Freq: Once | INTRAVENOUS | Status: AC
  Administered 2020-06-16: 1000 mL via INTRAVENOUS

## 2020-06-16 MED ORDER — PROMETHAZINE HCL 12.5 MG PO TABS: 12.5000 mg | ORAL_TABLET | Freq: Four times a day (QID) | ORAL | 0 refills | Status: DC | PRN

## 2020-06-16 MED ORDER — ACETAMINOPHEN 500 MG PO TABS
1000.0000 mg | ORAL_TABLET | Freq: Once | ORAL | Status: AC
  Administered 2020-06-16: 1000 mg via ORAL
  Filled 2020-06-16: qty 2

## 2020-06-16 MED ORDER — PROMETHAZINE HCL 25 MG/ML IJ SOLN
25.0000 mg | Freq: Once | INTRAMUSCULAR | Status: AC
  Administered 2020-06-16: 25 mg via INTRAVENOUS
  Filled 2020-06-16: qty 1

## 2020-06-16 NOTE — MAU Note (Signed)
Unable to keep down anything for 5 days. Was here a wk ago and given medicine but cannot keep it down. No VB or discharge. Some lower back pain that shoots down both legs

## 2020-06-16 NOTE — Discharge Instructions (Signed)
Morning Sickness  Morning sickness is when a woman feels nauseous during pregnancy. This nauseous feeling may or may not come with vomiting. It often occurs in the morning, but it can be a problem at any time of day. Morning sickness is most common during the first trimester. In some cases, it may continue throughout pregnancy. Although morning sickness is unpleasant, it is usually harmless unless the woman develops severe and continual vomiting (hyperemesis gravidarum), a condition that requires more intense treatment. What are the causes? The exact cause of this condition is not known, but it seems to be related to normal hormonal changes that occur in pregnancy. What increases the risk? You are more likely to develop this condition if:  You experienced nausea or vomiting before your pregnancy.  You had morning sickness during a previous pregnancy.  You are pregnant with more than one baby, such as twins. What are the signs or symptoms? Symptoms of this condition include:  Nausea.  Vomiting. How is this diagnosed? This condition is usually diagnosed based on your signs and symptoms. How is this treated? In many cases, treatment is not needed for this condition. Making some changes to what you eat may help to control symptoms. Your health care provider may also prescribe or recommend:  Vitamin B6 supplements.  Anti-nausea medicines.  Ginger. Follow these instructions at home: Medicines  Take over-the-counter and prescription medicines only as told by your health care provider. Do not use any prescription, over-the-counter, or herbal medicines for morning sickness without first talking with your health care provider.  Take multivitamins before getting pregnant. This can prevent or decrease the severity of morning sickness in most women. Eating and drinking  Eat a piece of dry toast or crackers before getting out of bed in the morning.  Eat 5 or 6 small meals a day.  Eat dry  and bland foods, such as rice or a baked potato. Foods that are high in carbohydrates are often helpful.  Avoid greasy, fatty, and spicy foods.  Have someone cook for you if the smell of any food causes nausea and vomiting.  If you feel nauseous after taking prenatal vitamins, take the vitamins at night or with a snack.  Eat a protein snack between meals if you are hungry. Nuts, yogurt, and cheese are good options.  Drink fluids throughout the day.  Try ginger ale made with real ginger, ginger tea made from fresh grated ginger, or ginger candies. General instructions  Do not use any products that contain nicotine or tobacco. These products include cigarettes, chewing tobacco, and vaping devices, such as e-cigarettes. If you need help quitting, ask your health care provider.  Get an air purifier to keep the air in your house free of odors.  Get plenty of fresh air.  Try to avoid odors that trigger your nausea.  Consider trying these methods to help relieve symptoms: ? Wearing an acupressure wristband. These wristbands are often worn for seasickness. ? Acupuncture. Contact a health care provider if:  Your home remedies are not working and you need medicine.  You feel dizzy or light-headed.  You are losing weight. Get help right away if:  You have persistent and uncontrolled nausea and vomiting.  You faint.  You have severe pain in your abdomen. Summary  Morning sickness is when a woman feels nauseous during pregnancy. This nauseous feeling may or may not come with vomiting.  Morning sickness is most common during the first trimester.  It often occurs in the   morning, but it can be a problem at any time of day.  In many cases, treatment is not needed for this condition. Making some changes to what you eat may help to control symptoms. This information is not intended to replace advice given to you by your health care provider. Make sure you discuss any questions you have  with your health care provider. Document Revised: 12/30/2019 Document Reviewed: 12/09/2019 Elsevier Patient Education  2021 Elsevier Inc.  

## 2020-06-16 NOTE — MAU Provider Note (Signed)
History     CSN: [id number]  Arrival date and time: 06/16/20 2054   Event Date/Time   First Provider Initiated Contact with Patient 06/16/20 2221      Chief Complaint  Patient presents with  . Back Pain  . Emesis During Pregnancy   HPI  [name] is a 30 y.o. female G37P3 @ [redacted]w[redacted]d here in MAU with nausea and vomiting, reports not being able to keep anything down. She reports taking Zofran every 8 hours, although it is causing some constipation. The N/V is an ongoing problem. She was prescribed Phenergan and was told that was not safe to use so she stopped it. Reports all over body aches, however no sore throat or fever. She is quarantined at home and has no known covid exposures. No bleeding.   OB History    Gravida  6   Para  3   Term      Preterm      AB      Living  3     SAB      IAB      Ectopic      Multiple      Live Births  3           Past Medical History:  Diagnosis Date  . Medical history non-contributory     Past Surgical History:  Procedure Laterality Date  . NO PAST SURGERIES      History reviewed. No pertinent family history.  Social History   Tobacco Use  . Smoking status: Current Some Day Smoker    Types: Cigarettes  . Smokeless tobacco: Never Used  Substance Use Topics  . Alcohol use: Yes  . Drug use: Yes    Types: Marijuana    Allergies:  Allergies  Allergen Reactions  . Codeine Swelling    Medications Prior to Admission  Medication Sig Dispense Refill Last Dose  . Prenatal Vit-Fe Fumarate-FA (PRENATAL MULTIVITAMIN) TABS tablet Take 1 tablet by mouth daily at 12 noon.   06/16/2020 at Unknown time  . amoxicillin (AMOXIL) 875 MG tablet Take 1 tablet (875 mg total) by mouth 2 (two) times daily. 14 tablet 0   . cyclobenzaprine (FLEXERIL) 10 MG tablet Take 1 tablet (10 mg total) by mouth 2 (two) times daily as needed for muscle spasms. 20 tablet 0   . ibuprofen (ADVIL) 800 MG tablet Take 1 tablet (800 mg total) by mouth  every 8 (eight) hours as needed for moderate pain. 21 tablet 0   . ipratropium (ATROVENT) 0.06 % nasal spray Place 2 sprays into both nostrils 4 (four) times daily. 15 mL 0   . naproxen (NAPROSYN) 500 MG tablet Take 1 tablet (500 mg total) by mouth 2 (two) times daily with a meal. 14 tablet 0   . ondansetron (ZOFRAN ODT) 8 MG disintegrating tablet Take 1 tablet (8 mg total) by mouth every 8 (eight) hours as needed for nausea or vomiting. Begin using once patch is taken off on 06/12/20 20 tablet 0    Results for orders placed or performed during the hospital encounter of 06/16/20 (from the past 48 hour(s))  Urinalysis, Routine w reflex microscopic Urine, Clean Catch     Status: Abnormal   Collection Time: 06/16/20  9:08 PM  Result Value Ref Range   Color, Urine YELLOW YELLOW   APPearance HAZY (A) CLEAR   Specific Gravity, Urine 1.034 (H) 1.005 - 1.030   pH 5.0 5.0 - 8.0   Glucose, UA NEGATIVE NEGATIVE  mg/dL   Hgb urine dipstick NEGATIVE NEGATIVE   Bilirubin Urine NEGATIVE NEGATIVE   Ketones, ur 20 (A) NEGATIVE mg/dL   Protein, ur 30 (A) NEGATIVE mg/dL   Nitrite NEGATIVE NEGATIVE   Leukocytes,Ua NEGATIVE NEGATIVE   RBC / HPF 6-10 0 - 5 RBC/hpf   WBC, UA 0-5 0 - 5 WBC/hpf   Bacteria, UA RARE (A) NONE SEEN   Squamous Epithelial / LPF 6-10 0 - 5   Mucus PRESENT     Comment: Performed at [facility], [address] [place], [place] [address]   Review of Systems  Constitutional: Negative for fever.  Gastrointestinal: Positive for nausea and vomiting. Negative for diarrhea.  Genitourinary: Negative for vaginal bleeding.   Physical Exam   Blood pressure 120/76, pulse (!) 103, temperature 98.2 F (36.8 C), resp. rate 18, height 5\' 3"  (1.6 m), weight 66.2 kg, last menstrual period 04/18/2020.  Physical Exam Constitutional:      General: She is not in acute distress.    Appearance: Normal appearance. She is ill-appearing.  HENT:     Head: Normocephalic.  Cardiovascular:      Rate and Rhythm: Normal rate.  Pulmonary:     Effort: Pulmonary effort is normal. No respiratory distress.     Breath sounds: Normal breath sounds.  Musculoskeletal:        General: Normal range of motion.     Cervical back: Normal range of motion.  Neurological:     Mental Status: She is alert and oriented to person, place, and time.  Psychiatric:        Behavior: Behavior normal.    MAU Course  Procedures  None  MDM  UA shows 20 ketones  No weight loss noted. LR bolus X 1 & phenergan 25 mg IV given Patient tolerating oral fluids prior to DC home.    Assessment and Plan   A:  1. Nausea and vomiting in pregnancy   2. Mild dehydration   3. [redacted] weeks gestation of pregnancy     P:  Discharge home in stable condition Rx: Phenergan, ok to continue Zofran Small, frequent meals Bland diet Ok to use OTC stool softeners    Rasch, 04/20/2020, NP 06/16/2020 11:38 PM

## 2020-07-06 LAB — OB RESULTS CONSOLE HIV ANTIBODY (ROUTINE TESTING): HIV: NONREACTIVE

## 2020-07-06 LAB — OB RESULTS CONSOLE ABO/RH: RH Type: POSITIVE

## 2020-07-06 LAB — OB RESULTS CONSOLE GC/CHLAMYDIA
Chlamydia: NEGATIVE
Gonorrhea: NEGATIVE

## 2020-07-06 LAB — OB RESULTS CONSOLE ANTIBODY SCREEN: Antibody Screen: NEGATIVE

## 2020-07-06 LAB — OB RESULTS CONSOLE HEPATITIS B SURFACE ANTIGEN: Hepatitis B Surface Ag: NEGATIVE

## 2020-07-06 LAB — OB RESULTS CONSOLE RUBELLA ANTIBODY, IGM: Rubella: IMMUNE

## 2020-07-06 LAB — OB RESULTS CONSOLE RPR: RPR: NONREACTIVE

## 2020-07-13 ENCOUNTER — Other Ambulatory Visit: Payer: Self-pay

## 2020-08-06 ENCOUNTER — Other Ambulatory Visit: Payer: Self-pay

## 2020-09-04 ENCOUNTER — Other Ambulatory Visit: Payer: Self-pay | Admitting: Obstetrics and Gynecology

## 2020-09-04 DIAGNOSIS — Z363 Encounter for antenatal screening for malformations: Secondary | ICD-10-CM

## 2020-09-07 ENCOUNTER — Ambulatory Visit: Payer: Medicaid Other

## 2020-09-07 ENCOUNTER — Ambulatory Visit: Payer: Medicaid Other | Attending: Obstetrics

## 2020-09-07 ENCOUNTER — Encounter: Payer: Self-pay | Admitting: *Deleted

## 2020-09-07 ENCOUNTER — Ambulatory Visit: Payer: Medicaid Other | Admitting: *Deleted

## 2020-09-07 ENCOUNTER — Other Ambulatory Visit: Payer: Self-pay

## 2020-09-07 VITALS — BP 96/56 | HR 94

## 2020-09-07 DIAGNOSIS — Z363 Encounter for antenatal screening for malformations: Secondary | ICD-10-CM

## 2020-09-08 ENCOUNTER — Other Ambulatory Visit: Payer: Self-pay | Admitting: *Deleted

## 2020-09-08 DIAGNOSIS — O09219 Supervision of pregnancy with history of pre-term labor, unspecified trimester: Secondary | ICD-10-CM

## 2020-10-06 ENCOUNTER — Other Ambulatory Visit: Payer: Self-pay | Admitting: *Deleted

## 2020-10-06 ENCOUNTER — Ambulatory Visit: Payer: Medicaid Other | Attending: Obstetrics

## 2020-10-06 ENCOUNTER — Other Ambulatory Visit: Payer: Self-pay

## 2020-10-06 ENCOUNTER — Encounter: Payer: Self-pay | Admitting: *Deleted

## 2020-10-06 ENCOUNTER — Ambulatory Visit: Payer: Medicaid Other | Admitting: *Deleted

## 2020-10-06 VITALS — BP 103/58 | HR 86

## 2020-10-06 DIAGNOSIS — Z8759 Personal history of other complications of pregnancy, childbirth and the puerperium: Secondary | ICD-10-CM

## 2020-10-06 DIAGNOSIS — O09219 Supervision of pregnancy with history of pre-term labor, unspecified trimester: Secondary | ICD-10-CM | POA: Insufficient documentation

## 2020-10-06 DIAGNOSIS — O09299 Supervision of pregnancy with other poor reproductive or obstetric history, unspecified trimester: Secondary | ICD-10-CM

## 2020-10-08 ENCOUNTER — Other Ambulatory Visit: Payer: Self-pay

## 2020-10-08 ENCOUNTER — Ambulatory Visit: Payer: Medicaid Other | Attending: Obstetrics and Gynecology

## 2020-10-08 DIAGNOSIS — M5442 Lumbago with sciatica, left side: Secondary | ICD-10-CM | POA: Insufficient documentation

## 2020-10-08 DIAGNOSIS — G8929 Other chronic pain: Secondary | ICD-10-CM | POA: Insufficient documentation

## 2020-10-08 DIAGNOSIS — O26899 Other specified pregnancy related conditions, unspecified trimester: Secondary | ICD-10-CM | POA: Insufficient documentation

## 2020-10-08 NOTE — Therapy (Signed)
[facility] [address] [place], [place], [address] Phone: [phone]   Fax:  [phone]  Physical Therapy Evaluation  Patient Details  Name: Bailey Blair MRN: [id number] Date of Birth: 12-30-1990 Referring Provider (PT): [name], MD   Encounter Date: 10/08/2020   PT End of Session - 10/08/20 2148    Visit Number 1    Number of Visits 13    Date for PT Re-Evaluation 11/28/20    Authorization Type [place] MEDICAID HEALTHY BLUE    Progress Note Due on Visit 10    PT Start Time 0915    PT Stop Time 1005    PT Time Calculation (min) 50 min    Activity Tolerance Patient tolerated treatment well    Behavior During Therapy [facility] for tasks assessed/performed           Past Medical History:  Diagnosis Date  . Medical history non-contributory   . Pregnancy induced hypertension     Past Surgical History:  Procedure Laterality Date  . NO PAST SURGERIES      There were no vitals filed for this visit.    Subjective Assessment - 10/08/20 0931    Subjective Pt reports low back and R LE since 03/19/21 prior to pregnancy. Pt is unable to recall a MOI. Low back pain is primarily on the R c R LE pain extended mostly to the knee, but sometimes to her foot. Pt reports periods without pain lasting about a day. Pt notes that with her pregnancy the low back and L LE has become wrose. The R leg can feel like it is going give out. Pt has 3 children ages 21-12.    Limitations House hold activities    How long can you sit comfortably? 1.5 hr    How long can you stand comfortably? 2 hr    How long can you walk comfortably? 20    Patient Stated Goals To relieve some of the pain    Currently in Pain? Yes    Pain Score 7     Pain Location Back    Pain Orientation Posterior;Right;Lower    Pain Descriptors / Indicators Pressure;Aching;Throbbing    Pain Type Chronic pain    Pain Radiating Towards R LE    Pain Onset More than a month  ago    Pain Frequency Intermittent    Aggravating Factors  Prolonged sitting    Pain Relieving Factors hot shower; lie down on l side    Effect of Pain on Daily Activities Wears her out              [facility] PT Assessment - 10/08/20 0001      Assessment   Medical Diagnosis Other specified pregnancy related conditions, unspecified trimester    Referring Provider (PT) [name], MD    Onset Date/Surgical Date --   low back and R LE since 03/19/21 prior to pregnancy   Hand Dominance Right    Prior Therapy No      Precautions   Precautions None      Restrictions   Weight Bearing Restrictions No      Balance Screen   Has the patient fallen in the past 6 months No      Home Environment   Living Environment Private residence    Living Arrangements Spouse/significant other;Children    Type of Home Apartment    Home Access Stairs to enter    Entrance Stairs-Number of Steps 16  Entrance Stairs-Rails Left;Right    Home Layout One level      Prior Function   Level of Independence Independent    Vocation Full time 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 Supine c the LEs extended with increased lumbar lordosis also increased pt's low back and R LE pain. Lumbar trunk flexion in sitting and supine c knees flexed and elevated resolved pt's LE pain.      Strength   Overall Strength Comments LE  myotomal screen was negative      Flexibility   Soft Tissue Assessment /Muscle Length yes    Hamstrings WNLs; SLR negative bilat    Piriformis WFLs      Palpation   SI assessment  DIstaction negative. Compression positive      Special Tests    Special Tests Lumbar    Lumbar Tests Slump Test      Slump test   Findings Negative      Transfers   Transfers Sit to Stand;Stand to Sit    Sit to Stand 7: Independent      Ambulation/Gait   Ambulation/Gait Yes    Ambulation/Gait Assistance 7: Independent    Gait Pattern Step-through pattern                      Objective measurements completed on examination: See above findings.               PT Education - 10/08/20 2146    Education Details Eval findings, POC, HEP, sleeping positions and support for comfort, sitting positions for comfort, pregnanacy support belt    Person(s) Educated Patient    Methods Explanation;Demonstration;Tactile cues;Verbal cues;Handout    Comprehension Verbalized understanding;Returned demonstration;Verbal cues required;Tactile cues required            PT Short Term Goals - 10/08/20 2213      PT SHORT TERM GOAL #1   Title Pt will be ind in an initial HEP    Baseline started on eval    Status New    Target Date 10/29/20      PT SHORT TERM GOAL #2   Title Pt will voice understanding of measures to assist in the reduction of her low back and L LE pain    Status New    Target Date 10/29/20             PT Long Term Goals - 10/08/20 2216      PT LONG TERM GOAL #1   Title Pt will report 50% improve in her low back and L LE pain with her daily activities    Status New    Target Date 11/28/20      PT LONG TERM GOAL #2   Title Pt will demonstrate proper body mechanics for household  activities to reduce low back strain    Status New    Target Date 11/28/20      PT LONG TERM GOAL #3   Title Pt will be Ind in a final HEP to maintain achieved LOF    Status New     Target Date 11/28/20                  Plan - 10/08/20 2154    Clinical Impression Statement Pt presents to PT with chronic low back and L LE pain which has wrosened with her pregnancy. MOI is unknown. Pt is in the 26th week of her pregnancy. Today movements and postions which increased lumbar extension, increased pt's low back and L LE pain. Conversely, movements and positions which promoted lumbar flexion, decreased her low back pain and centralized her L LE pain. Pt will benefit from PT 2w6 to reduce low back and L LE pain and promote lumbar mobility and stability to optimize her functional mobility during pregnancy.    Personal Factors and Comorbidities Time since onset of injury/illness/exacerbation    Examination-Activity Limitations Caring for Others;Carry;Lift;Sit;Squat    Examination-Participation Restrictions Occupation;Community Activity;Shop;Cleaning    Stability/Clinical Decision Making Evolving/Moderate complexity    Clinical Decision Making Moderate    Rehab Potential Good    PT Frequency 2x / week    PT Duration 6 weeks    PT Treatment/Interventions ADLs/Self Care Home Management;Cryotherapy;Electrical Stimulation;Moist Heat;Therapeutic exercise;Therapeutic activities;Patient/family education;Manual techniques;Dry needling;Joint Manipulations;Spinal Manipulations;Taping;Passive range of motion    PT Next Visit Plan Assess response to HEP. Progress ther ex as indicated    PT Home Exercise Plan [email or web address]    Consulted and Agree with Plan of Care Patient           Patient will benefit from skilled therapeutic intervention in order to improve the following deficits and impairments:  Pain,Postural dysfunction  Visit Diagnosis: Other specified pregnancy related conditions, unspecified trimester  Chronic bilateral low back pain with left-sided sciatica     Problem List There are no problems to display for this patient.  Joellyn Rued MS, PT 10/08/20 10:23 PM  [facility]  [facility] Outpatient [facility] [address] [place], [place], [address] Phone: [phone]   Fax:  [phone]  Name: Sahian Kerney MRN: [id number] Date of Birth: Oct 03, 1990   Check all possible CPT codes: 97110- Therapeutic Exercise, 97140 - Manual Therapy, 97530 - Therapeutic Activities, 97535 - Self Care, [phone] - Electrical stimulation (Manual) and [id number] - Ultrasound

## 2020-10-12 ENCOUNTER — Telehealth: Payer: Self-pay | Admitting: Physical Therapy

## 2020-10-12 ENCOUNTER — Ambulatory Visit: Payer: Medicaid Other | Admitting: Physical Therapy

## 2020-10-12 NOTE — Telephone Encounter (Signed)
Patient missed today's appointment.  Call patient and left voice mail to return call to reschedule at [phone] and reminded patient of next scheduled appointment on 10/16/2020 with request to call if unable to attend.

## 2020-10-16 ENCOUNTER — Ambulatory Visit: Payer: Medicaid Other

## 2020-10-17 ENCOUNTER — Telehealth: Payer: Self-pay

## 2020-10-17 NOTE — Telephone Encounter (Signed)
Spoke with pt. For potential no show visit on 10/16/20. Pt stateed she called yesterday and spoke to someone re: cancelling her appt. Pt plans to attend her 10/21/20 appt.

## 2020-10-19 NOTE — Telephone Encounter (Signed)
Patient did not show up for appointment.  Left message to call to reschedule and next appointment time.

## 2020-10-21 ENCOUNTER — Other Ambulatory Visit: Payer: Self-pay

## 2020-10-21 ENCOUNTER — Encounter: Payer: Self-pay | Admitting: Physical Therapy

## 2020-10-21 ENCOUNTER — Ambulatory Visit: Payer: Medicaid Other | Admitting: Physical Therapy

## 2020-10-21 DIAGNOSIS — O26899 Other specified pregnancy related conditions, unspecified trimester: Secondary | ICD-10-CM | POA: Diagnosis not present

## 2020-10-21 DIAGNOSIS — G8929 Other chronic pain: Secondary | ICD-10-CM

## 2020-10-21 DIAGNOSIS — M5442 Lumbago with sciatica, left side: Secondary | ICD-10-CM

## 2020-10-21 NOTE — Therapy (Addendum)
[place], [place], [address] Phone: [phone]   Fax:  [phone]  Physical Therapy Treatment/DISCHARGE SUMMARY  Patient Details  Name: Bailey Blair MRN: [id number] Date of Birth: August 07, 1990 Referring Provider (PT): Rowland Lathe, MD   Encounter Date: 10/21/2020   PT End of Session - 10/21/20 1001     Visit Number 2    Number of Visits 13    Date for PT Re-Evaluation 11/28/20    Authorization Type [place] MEDICAID HEALTHY BLUE    PT Start Time 0912    PT Stop Time 0100    PT Time Calculation (min) 948 min    Activity Tolerance Patient tolerated treatment well    Behavior During Therapy [facility] for tasks assessed/performed             Past Medical History:  Diagnosis Date   Medical history non-contributory    Pregnancy induced hypertension     Past Surgical History:  Procedure Laterality Date   NO PAST SURGERIES      There were no vitals filed for this visit.   Subjective Assessment - 10/21/20 0914     Subjective "Just a little achy, might be the weather." Patient reports the HEP is going well, the stretches are helping a lot.    Limitations House hold activities    Currently in Pain? Yes    Pain Score 2     Pain Location Back    Pain Orientation Left;Lower    Pain Descriptors / Indicators Aching    Pain Type Chronic pain                [facility] PT Assessment - 10/21/20 0001       Assessment   Medical Diagnosis Other specified pregnancy related conditions, unspecified trimester    Referring Provider (PT) Rowland Lathe, MD                           [facility] Adult PT Treatment/Exercise - 10/21/20 0001       Lumbar Exercises: Stretches   Passive Hamstring Stretch 10 seconds    Passive Hamstring Stretch Limitations Sidelying manual with resisted push for muscle energy.    Other Lumbar Stretch Exercise Ball roll out FWD/R/L 3x10ea dir   table slide for  HEP     Lumbar Exercises: Aerobic   Nustep L6x60mn      Lumbar Exercises: Seated   Other Seated Lumbar Exercises Scap retraction 3sec x10      Lumbar Exercises: Supine   Bridge with BCardinal Health10 reps    Bridge with BCardinal HealthLimitations 2 sets      Lumbar Exercises: Sidelying   Clam 10 reps    Clam Limitations R/L    Hip Abduction 10 reps    Hip Abduction Limitations R/L      Knee/Hip Exercises: Standing   Lateral Step Up 10 reps    Lateral Step Up Limitations R/L    Wall Squat 2 sets;10 reps      Manual Therapy   Manual Therapy Joint mobilization;Soft tissue mobilization    Joint Mobilization long axis distraction L    Soft tissue mobilization Lumbar and lower thoracic paraspinals, WL region                    PT Education - 10/21/20 1000     Education Details Reviewed and modified HEP with addition of wall squat/slides, muscle  energy for affected pelvic side.    Person(s) Educated Patient    Methods Explanation;Verbal cues;Handout    Comprehension Verbalized understanding              PT Short Term Goals - 10/08/20 2213       PT SHORT TERM GOAL #1   Title Pt will be ind in an initial HEP    Baseline started on eval    Status New    Target Date 10/29/20      PT SHORT TERM GOAL #2   Title Pt will voice understanding of measures to assist in the reduction of her low back and L LE pain    Status New    Target Date 10/29/20               PT Long Term Goals - 10/08/20 2216       PT LONG TERM GOAL #1   Title Pt will report 50% improve in her low back and L LE pain with her daily activities    Status New    Target Date 11/28/20      PT LONG TERM GOAL #2   Title Pt will demonstrate proper body mechanics for household activities to reduce low back strain    Status New    Target Date 11/28/20      PT LONG TERM GOAL #3   Title Pt will be Ind in a final HEP to maintain achieved LOF    Status New    Target Date 11/28/20                    Plan - 10/21/20 1002     Clinical Impression Statement Patient reports imporved L side symptoms after treatment.  Patient continues to be challenged by intermittent LBP that alternates between right and left.  Patient will benefit fom continued skilled PT to address deficits and maximzie daily activiy with decreased pain during pregnanacy.    Examination-Activity Limitations Caring for Others;Carry;Lift;Sit;Squat    Examination-Participation Restrictions Occupation;Community Activity;Shop;Cleaning    PT Treatment/Interventions ADLs/Self Care Home Management;Cryotherapy;Electrical Stimulation;Moist Heat;Therapeutic exercise;Therapeutic activities;Patient/family education;Manual techniques;Dry needling;Joint Manipulations;Spinal Manipulations;Taping;Passive range of motion    PT Next Visit Plan Assess response to HEP. Progress ther ex as indicated    PT Home Exercise Plan [email or web address]    Consulted and Agree with Plan of Care Patient             Patient will benefit from skilled therapeutic intervention in order to improve the following deficits and impairments:     Visit Diagnosis: Other specified pregnancy related conditions, unspecified trimester  Chronic bilateral low back pain with left-sided sciatica     Problem List There are no problems to display for this patient.   Pollyann Samples, PT 10/21/2020, 10:06 AM  [facility] [address] [place], [place], [address] Phone: [phone]   Fax:  [phone]  Name: Bailey Blair MRN: [id number] Date of Birth: 1990/12/26  PHYSICAL THERAPY DISCHARGE SUMMARY  Visits from Start of Care: 2  Current functional level related to goals / functional outcomes: As at eval    Remaining deficits: As at eval   Education / Equipment: HEP   Patient agrees to discharge. Patient goals were not met. Patient is being discharged due to not returning since the last visit.    Pollyann Samples, PT

## 2020-10-21 NOTE — Patient Instructions (Signed)
[email or web address]   Side lying version edited:

## 2020-10-27 ENCOUNTER — Ambulatory Visit: Payer: Medicaid Other | Admitting: Physical Therapy

## 2020-10-27 ENCOUNTER — Telehealth: Payer: Self-pay | Admitting: Physical Therapy

## 2020-10-27 NOTE — Telephone Encounter (Signed)
Patient did not show for scheduled visit.  Patient called, left voice mail at [phone] about missed appointment and that next visit is 10/30/2020 at 11:30 and the attendance policy may require that appointments be scheduled one at a time, to call office with questions.

## 2020-10-30 ENCOUNTER — Ambulatory Visit: Payer: Medicaid Other | Admitting: Physical Therapy

## 2020-10-30 ENCOUNTER — Telehealth: Payer: Self-pay | Admitting: Physical Therapy

## 2020-10-30 NOTE — Telephone Encounter (Addendum)
Called and left VM reminding patient of missed visit and d/t attendance policy we would need to D/C.  I let her know if she feels she needs to come back she could get another MD referral and that she can call the clinic with any questions.  Alphonzo Severance PT, DPT 10/30/20 12:00 PM

## 2020-11-02 ENCOUNTER — Ambulatory Visit: Payer: Medicaid Other | Admitting: Physical Therapy

## 2020-11-04 ENCOUNTER — Ambulatory Visit: Payer: Medicaid Other

## 2020-11-04 ENCOUNTER — Telehealth: Payer: Self-pay

## 2020-11-04 ENCOUNTER — Ambulatory Visit: Payer: Medicaid Other | Attending: Obstetrics

## 2020-11-04 NOTE — Telephone Encounter (Signed)
Mar/Patient called to advise she will be a few minutes late-advised pt if not checked in by 9a will need to r/s. Patient very upset.

## 2020-11-06 ENCOUNTER — Encounter: Payer: Medicaid Other | Admitting: Physical Therapy

## 2020-11-16 ENCOUNTER — Other Ambulatory Visit: Payer: Self-pay

## 2020-11-16 ENCOUNTER — Ambulatory Visit: Payer: Medicaid Other | Admitting: *Deleted

## 2020-11-16 ENCOUNTER — Other Ambulatory Visit: Payer: Self-pay | Admitting: *Deleted

## 2020-11-16 ENCOUNTER — Ambulatory Visit: Payer: Medicaid Other | Attending: Obstetrics

## 2020-11-16 ENCOUNTER — Encounter: Payer: Self-pay | Admitting: *Deleted

## 2020-11-16 VITALS — BP 104/62 | HR 78

## 2020-11-16 DIAGNOSIS — Z3A30 30 weeks gestation of pregnancy: Secondary | ICD-10-CM | POA: Diagnosis not present

## 2020-11-16 DIAGNOSIS — O283 Abnormal ultrasonic finding on antenatal screening of mother: Secondary | ICD-10-CM | POA: Diagnosis not present

## 2020-11-16 DIAGNOSIS — Z8759 Personal history of other complications of pregnancy, childbirth and the puerperium: Secondary | ICD-10-CM | POA: Diagnosis not present

## 2020-11-16 DIAGNOSIS — O09299 Supervision of pregnancy with other poor reproductive or obstetric history, unspecified trimester: Secondary | ICD-10-CM | POA: Diagnosis present

## 2020-11-16 DIAGNOSIS — O09293 Supervision of pregnancy with other poor reproductive or obstetric history, third trimester: Secondary | ICD-10-CM | POA: Diagnosis not present

## 2020-11-20 ENCOUNTER — Other Ambulatory Visit: Payer: Self-pay

## 2020-11-20 ENCOUNTER — Inpatient Hospital Stay (HOSPITAL_COMMUNITY)
Admission: AD | Admit: 2020-11-20 | Discharge: 2020-11-20 | Disposition: A | Discharge status: Home / Self Care | Payer: Medicaid Other | Source: Ambulatory Visit | Attending: Obstetrics and Gynecology | Admitting: Obstetrics and Gynecology

## 2020-11-20 DIAGNOSIS — N898 Other specified noninflammatory disorders of vagina: Secondary | ICD-10-CM | POA: Insufficient documentation

## 2020-11-20 DIAGNOSIS — Z3A30 30 weeks gestation of pregnancy: Secondary | ICD-10-CM | POA: Diagnosis not present

## 2020-11-20 DIAGNOSIS — O479 False labor, unspecified: Secondary | ICD-10-CM

## 2020-11-20 DIAGNOSIS — O26893 Other specified pregnancy related conditions, third trimester: Secondary | ICD-10-CM | POA: Diagnosis not present

## 2020-11-20 DIAGNOSIS — Z0371 Encounter for suspected problem with amniotic cavity and membrane ruled out: Secondary | ICD-10-CM

## 2020-11-20 LAB — WET PREP, GENITAL
Clue Cells Wet Prep HPF POC: NONE SEEN
Sperm: NONE SEEN
Trich, Wet Prep: NONE SEEN
Yeast Wet Prep HPF POC: NONE SEEN

## 2020-11-20 LAB — URINALYSIS, ROUTINE W REFLEX MICROSCOPIC
Bilirubin Urine: NEGATIVE
Glucose, UA: NEGATIVE mg/dL
Hgb urine dipstick: NEGATIVE
Ketones, ur: NEGATIVE mg/dL
Nitrite: NEGATIVE
Protein, ur: NEGATIVE mg/dL
Specific Gravity, Urine: 1.006 (ref 1.005–1.030)
pH: 7 (ref 5.0–8.0)

## 2020-11-20 NOTE — MAU Provider Note (Signed)
S: Bailey Blair is a 30 y.o. [id number] at [redacted]w[redacted]d by LMP who presents to MAU today complaining of leaking of fluid since 1500 today. She denies vaginal bleeding. She  endorses intermittent  contractions. She reports normal fetal movement.    O: BP 114/65 (BP Location: Right Arm)   Pulse 88   Temp 98.4 F (36.9 C) (Oral)   Resp 17   Ht 5\' 3"  (1.6 m)   Wt 82 kg   LMP 04/18/2020 (Exact Date)   SpO2 98%   BMI 32.01 kg/m  GENERAL: Well-developed, well-nourished female in no acute distress.  HEAD: Normocephalic, atraumatic.  CHEST: Normal effort of breathing, regular heart rate ABDOMEN: Soft, nontender, gravid PELVIC: Normal external female genitalia. Vagina is pink and rugated. Cervix with normal contour, no lesions. Normal whitish discharge. Negative pooling. Negative ferning.  Cervical exam: Fingertip/thick/high Dilation: Fingertip Effacement (%): Thick Exam by:: a Leisha Trinkle md Fetal Monitoring: Baseline: 140 Variability: moderate Accelerations: multiple 15x15, reactive Decelerations: several mild variable decels Contractions: rare  Results for orders placed or performed during the hospital encounter of 11/20/20 (from the past 24 hour(s))  Urinalysis, Routine w reflex microscopic Urine, Clean Catch     Status: Abnormal   Collection Time: 11/20/20  4:41 PM  Result Value Ref Range   Color, Urine STRAW (A) YELLOW   APPearance CLEAR CLEAR   Specific Gravity, Urine 1.006 1.005 - 1.030   pH 7.0 5.0 - 8.0   Glucose, UA NEGATIVE NEGATIVE mg/dL   Hgb urine dipstick NEGATIVE NEGATIVE   Bilirubin Urine NEGATIVE NEGATIVE   Ketones, ur NEGATIVE NEGATIVE mg/dL   Protein, ur NEGATIVE NEGATIVE mg/dL   Nitrite NEGATIVE NEGATIVE   Leukocytes,Ua SMALL (A) NEGATIVE   RBC / HPF 0-5 0 - 5 RBC/hpf   WBC, UA 6-10 0 - 5 WBC/hpf   Bacteria, UA RARE (A) NONE SEEN   Squamous Epithelial / LPF 0-5 0 - 5   Assessment & Plan: SIUP at [redacted]w[redacted]d. No sign of PPROM on exam. Reactive FHT. Negative UA & wet  prep. -Plan to follow-up with MFM on 11/23/20 or sooner for strict return precautions.  11/25/20, MD 11/20/2020 6:13 PM

## 2020-11-20 NOTE — MAU Note (Signed)
OB sent her in because she has been leaking a little bit of fluid, not sure if discharge or leaking. No bleeding. Has pain "every now and then".

## 2020-11-23 ENCOUNTER — Ambulatory Visit: Payer: Medicaid Other | Attending: Obstetrics

## 2020-11-23 ENCOUNTER — Ambulatory Visit: Payer: Medicaid Other | Admitting: *Deleted

## 2020-11-23 ENCOUNTER — Encounter: Payer: Self-pay | Admitting: *Deleted

## 2020-11-23 ENCOUNTER — Other Ambulatory Visit: Payer: Self-pay

## 2020-11-23 VITALS — BP 114/66 | HR 88

## 2020-11-23 DIAGNOSIS — Z3A31 31 weeks gestation of pregnancy: Secondary | ICD-10-CM

## 2020-11-23 DIAGNOSIS — Z8759 Personal history of other complications of pregnancy, childbirth and the puerperium: Secondary | ICD-10-CM

## 2020-11-23 DIAGNOSIS — Z362 Encounter for other antenatal screening follow-up: Secondary | ICD-10-CM

## 2020-11-23 DIAGNOSIS — O09293 Supervision of pregnancy with other poor reproductive or obstetric history, third trimester: Secondary | ICD-10-CM

## 2020-11-23 DIAGNOSIS — O283 Abnormal ultrasonic finding on antenatal screening of mother: Secondary | ICD-10-CM | POA: Diagnosis not present

## 2020-11-23 LAB — GC/CHLAMYDIA PROBE AMP (~~LOC~~) NOT AT ARMC
Chlamydia: NEGATIVE
Comment: NEGATIVE
Comment: NORMAL
Neisseria Gonorrhea: NEGATIVE

## 2020-12-02 ENCOUNTER — Other Ambulatory Visit: Payer: Self-pay

## 2020-12-02 ENCOUNTER — Ambulatory Visit: Payer: Medicaid Other | Attending: Obstetrics

## 2020-12-02 ENCOUNTER — Encounter: Payer: Self-pay | Admitting: *Deleted

## 2020-12-02 ENCOUNTER — Ambulatory Visit: Payer: Medicaid Other | Admitting: *Deleted

## 2020-12-02 VITALS — BP 109/75 | HR 93

## 2020-12-02 DIAGNOSIS — Z362 Encounter for other antenatal screening follow-up: Secondary | ICD-10-CM

## 2020-12-02 DIAGNOSIS — O09299 Supervision of pregnancy with other poor reproductive or obstetric history, unspecified trimester: Secondary | ICD-10-CM | POA: Insufficient documentation

## 2020-12-02 DIAGNOSIS — O283 Abnormal ultrasonic finding on antenatal screening of mother: Secondary | ICD-10-CM | POA: Diagnosis not present

## 2020-12-02 DIAGNOSIS — Z3A32 32 weeks gestation of pregnancy: Secondary | ICD-10-CM | POA: Diagnosis not present

## 2020-12-02 DIAGNOSIS — Z8759 Personal history of other complications of pregnancy, childbirth and the puerperium: Secondary | ICD-10-CM | POA: Diagnosis not present

## 2020-12-02 DIAGNOSIS — O09293 Supervision of pregnancy with other poor reproductive or obstetric history, third trimester: Secondary | ICD-10-CM | POA: Diagnosis not present

## 2020-12-07 ENCOUNTER — Other Ambulatory Visit: Payer: Self-pay

## 2020-12-07 ENCOUNTER — Ambulatory Visit: Payer: Medicaid Other | Attending: Obstetrics

## 2020-12-07 ENCOUNTER — Encounter: Payer: Self-pay | Admitting: *Deleted

## 2020-12-07 ENCOUNTER — Ambulatory Visit: Payer: Medicaid Other | Admitting: *Deleted

## 2020-12-07 VITALS — BP 110/66 | HR 92

## 2020-12-07 DIAGNOSIS — Z3A33 33 weeks gestation of pregnancy: Secondary | ICD-10-CM

## 2020-12-07 DIAGNOSIS — O09299 Supervision of pregnancy with other poor reproductive or obstetric history, unspecified trimester: Secondary | ICD-10-CM

## 2020-12-07 DIAGNOSIS — Z362 Encounter for other antenatal screening follow-up: Secondary | ICD-10-CM

## 2020-12-07 DIAGNOSIS — Z8759 Personal history of other complications of pregnancy, childbirth and the puerperium: Secondary | ICD-10-CM | POA: Insufficient documentation

## 2020-12-07 DIAGNOSIS — O283 Abnormal ultrasonic finding on antenatal screening of mother: Secondary | ICD-10-CM | POA: Diagnosis not present

## 2020-12-07 DIAGNOSIS — O09293 Supervision of pregnancy with other poor reproductive or obstetric history, third trimester: Secondary | ICD-10-CM | POA: Diagnosis not present

## 2020-12-14 ENCOUNTER — Other Ambulatory Visit: Payer: Self-pay

## 2020-12-14 ENCOUNTER — Ambulatory Visit: Payer: Medicaid Other | Admitting: *Deleted

## 2020-12-14 ENCOUNTER — Ambulatory Visit: Payer: Medicaid Other | Attending: Obstetrics

## 2020-12-14 ENCOUNTER — Encounter: Payer: Self-pay | Admitting: *Deleted

## 2020-12-14 VITALS — BP 107/69 | HR 91

## 2020-12-14 DIAGNOSIS — Z3A32 32 weeks gestation of pregnancy: Secondary | ICD-10-CM | POA: Diagnosis not present

## 2020-12-14 DIAGNOSIS — Z8759 Personal history of other complications of pregnancy, childbirth and the puerperium: Secondary | ICD-10-CM | POA: Diagnosis not present

## 2020-12-14 DIAGNOSIS — O09213 Supervision of pregnancy with history of pre-term labor, third trimester: Secondary | ICD-10-CM | POA: Diagnosis not present

## 2020-12-14 DIAGNOSIS — O09299 Supervision of pregnancy with other poor reproductive or obstetric history, unspecified trimester: Secondary | ICD-10-CM

## 2020-12-14 DIAGNOSIS — O283 Abnormal ultrasonic finding on antenatal screening of mother: Secondary | ICD-10-CM | POA: Diagnosis not present

## 2020-12-21 ENCOUNTER — Other Ambulatory Visit: Payer: Self-pay | Admitting: *Deleted

## 2020-12-21 ENCOUNTER — Other Ambulatory Visit: Payer: Self-pay

## 2020-12-21 ENCOUNTER — Encounter: Payer: Self-pay | Admitting: *Deleted

## 2020-12-21 ENCOUNTER — Ambulatory Visit: Payer: Medicaid Other | Admitting: *Deleted

## 2020-12-21 ENCOUNTER — Ambulatory Visit: Payer: Medicaid Other | Attending: Obstetrics

## 2020-12-21 VITALS — BP 112/74 | HR 82

## 2020-12-21 DIAGNOSIS — Z362 Encounter for other antenatal screening follow-up: Secondary | ICD-10-CM | POA: Diagnosis not present

## 2020-12-21 DIAGNOSIS — Z8759 Personal history of other complications of pregnancy, childbirth and the puerperium: Secondary | ICD-10-CM | POA: Insufficient documentation

## 2020-12-21 DIAGNOSIS — O09293 Supervision of pregnancy with other poor reproductive or obstetric history, third trimester: Secondary | ICD-10-CM | POA: Diagnosis not present

## 2020-12-21 DIAGNOSIS — O283 Abnormal ultrasonic finding on antenatal screening of mother: Secondary | ICD-10-CM

## 2020-12-21 DIAGNOSIS — Z3A35 35 weeks gestation of pregnancy: Secondary | ICD-10-CM | POA: Diagnosis not present

## 2020-12-21 DIAGNOSIS — O09299 Supervision of pregnancy with other poor reproductive or obstetric history, unspecified trimester: Secondary | ICD-10-CM

## 2020-12-28 ENCOUNTER — Ambulatory Visit: Payer: Medicaid Other | Admitting: *Deleted

## 2020-12-28 ENCOUNTER — Encounter: Payer: Self-pay | Admitting: *Deleted

## 2020-12-28 ENCOUNTER — Other Ambulatory Visit: Payer: Self-pay

## 2020-12-28 ENCOUNTER — Ambulatory Visit: Payer: Medicaid Other | Attending: Obstetrics and Gynecology

## 2020-12-28 ENCOUNTER — Other Ambulatory Visit: Payer: Self-pay | Admitting: Obstetrics and Gynecology

## 2020-12-28 VITALS — BP 109/70 | HR 82

## 2020-12-28 DIAGNOSIS — Z3A36 36 weeks gestation of pregnancy: Secondary | ICD-10-CM | POA: Diagnosis present

## 2020-12-28 DIAGNOSIS — O09299 Supervision of pregnancy with other poor reproductive or obstetric history, unspecified trimester: Secondary | ICD-10-CM | POA: Diagnosis present

## 2020-12-28 DIAGNOSIS — Z8759 Personal history of other complications of pregnancy, childbirth and the puerperium: Secondary | ICD-10-CM | POA: Insufficient documentation

## 2021-01-02 ENCOUNTER — Encounter (HOSPITAL_COMMUNITY): Payer: Self-pay | Admitting: Obstetrics and Gynecology

## 2021-01-02 ENCOUNTER — Other Ambulatory Visit: Payer: Self-pay

## 2021-01-02 ENCOUNTER — Inpatient Hospital Stay (HOSPITAL_COMMUNITY)
Admission: AD | Admit: 2021-01-02 | Discharge: 2021-01-02 | Disposition: A | Discharge status: Home / Self Care | Payer: Medicaid Other | Attending: Obstetrics and Gynecology | Admitting: Obstetrics and Gynecology

## 2021-01-02 DIAGNOSIS — R102 Pelvic and perineal pain: Secondary | ICD-10-CM | POA: Diagnosis present

## 2021-01-02 DIAGNOSIS — Z3A37 37 weeks gestation of pregnancy: Secondary | ICD-10-CM

## 2021-01-02 DIAGNOSIS — O219 Vomiting of pregnancy, unspecified: Secondary | ICD-10-CM

## 2021-01-02 DIAGNOSIS — B373 Candidiasis of vulva and vagina: Secondary | ICD-10-CM | POA: Diagnosis not present

## 2021-01-02 DIAGNOSIS — O212 Late vomiting of pregnancy: Secondary | ICD-10-CM | POA: Diagnosis not present

## 2021-01-02 LAB — URINALYSIS, ROUTINE W REFLEX MICROSCOPIC
Bilirubin Urine: NEGATIVE
Glucose, UA: NEGATIVE mg/dL
Hgb urine dipstick: NEGATIVE
Ketones, ur: NEGATIVE mg/dL
Leukocytes,Ua: NEGATIVE
Nitrite: NEGATIVE
Protein, ur: NEGATIVE mg/dL
Specific Gravity, Urine: 1.011 (ref 1.005–1.030)
pH: 9 — ABNORMAL HIGH (ref 5.0–8.0)

## 2021-01-02 NOTE — Discharge Instructions (Signed)

## 2021-01-02 NOTE — MAU Note (Signed)
Patient states she had [employer] around 2300 last night. At 0130 today, she states she threw up and had an episode of diarrhea. She states she had one more episode of emesis and diarrhea at 0300 and none since.

## 2021-01-02 NOTE — MAU Note (Signed)
Has been vomiting since last night, the last time she through up, there was a lot of blood in it.  Also having diarrhea since last night, watery.  Feeling a lot of vaginal pressure. Denies anyone else at home being sick. Abd cramping.  Denies fever.

## 2021-01-04 LAB — OB RESULTS CONSOLE GBS: GBS: POSITIVE

## 2021-01-05 ENCOUNTER — Other Ambulatory Visit: Payer: Self-pay

## 2021-01-05 ENCOUNTER — Ambulatory Visit: Payer: Medicaid Other | Admitting: *Deleted

## 2021-01-05 ENCOUNTER — Ambulatory Visit: Payer: Medicaid Other | Attending: Obstetrics and Gynecology

## 2021-01-05 VITALS — BP 113/75 | HR 95

## 2021-01-05 DIAGNOSIS — O283 Abnormal ultrasonic finding on antenatal screening of mother: Secondary | ICD-10-CM | POA: Diagnosis not present

## 2021-01-05 DIAGNOSIS — Z8759 Personal history of other complications of pregnancy, childbirth and the puerperium: Secondary | ICD-10-CM

## 2021-01-05 DIAGNOSIS — Z3A37 37 weeks gestation of pregnancy: Secondary | ICD-10-CM

## 2021-01-05 DIAGNOSIS — O09293 Supervision of pregnancy with other poor reproductive or obstetric history, third trimester: Secondary | ICD-10-CM | POA: Diagnosis not present

## 2021-01-12 ENCOUNTER — Encounter (HOSPITAL_COMMUNITY): Payer: Self-pay

## 2021-01-12 ENCOUNTER — Encounter: Payer: Self-pay | Admitting: *Deleted

## 2021-01-12 ENCOUNTER — Encounter (HOSPITAL_COMMUNITY): Payer: Self-pay | Admitting: *Deleted

## 2021-01-12 ENCOUNTER — Other Ambulatory Visit: Payer: Self-pay

## 2021-01-12 ENCOUNTER — Ambulatory Visit: Payer: Medicaid Other | Admitting: *Deleted

## 2021-01-12 ENCOUNTER — Telehealth (HOSPITAL_COMMUNITY): Payer: Self-pay | Admitting: *Deleted

## 2021-01-12 ENCOUNTER — Ambulatory Visit: Payer: Medicaid Other | Attending: Obstetrics and Gynecology

## 2021-01-12 VITALS — BP 110/72 | HR 94

## 2021-01-12 DIAGNOSIS — O283 Abnormal ultrasonic finding on antenatal screening of mother: Secondary | ICD-10-CM

## 2021-01-12 DIAGNOSIS — Z8759 Personal history of other complications of pregnancy, childbirth and the puerperium: Secondary | ICD-10-CM

## 2021-01-12 DIAGNOSIS — O09293 Supervision of pregnancy with other poor reproductive or obstetric history, third trimester: Secondary | ICD-10-CM | POA: Diagnosis not present

## 2021-01-12 DIAGNOSIS — Z362 Encounter for other antenatal screening follow-up: Secondary | ICD-10-CM

## 2021-01-12 DIAGNOSIS — Z3A38 38 weeks gestation of pregnancy: Secondary | ICD-10-CM

## 2021-01-12 NOTE — Telephone Encounter (Signed)
Preadmission screen  

## 2021-01-13 ENCOUNTER — Other Ambulatory Visit: Payer: Self-pay | Admitting: Obstetrics and Gynecology

## 2021-01-13 LAB — SARS CORONAVIRUS 2 (TAT 6-24 HRS): SARS Coronavirus 2: NEGATIVE

## 2021-01-16 ENCOUNTER — Inpatient Hospital Stay (HOSPITAL_COMMUNITY): Payer: Medicaid Other

## 2021-01-16 ENCOUNTER — Encounter (HOSPITAL_COMMUNITY): Payer: Self-pay | Admitting: Obstetrics and Gynecology

## 2021-01-16 ENCOUNTER — Inpatient Hospital Stay (HOSPITAL_COMMUNITY)
Admission: AD | Admit: 2021-01-16 | Discharge: 2021-01-19 | DRG: 807 | Disposition: A | Discharge status: Home / Self Care | Payer: Medicaid Other | Attending: Obstetrics and Gynecology | Admitting: Obstetrics and Gynecology

## 2021-01-16 ENCOUNTER — Other Ambulatory Visit: Payer: Self-pay

## 2021-01-16 DIAGNOSIS — O99824 Streptococcus B carrier state complicating childbirth: Secondary | ICD-10-CM | POA: Diagnosis present

## 2021-01-16 DIAGNOSIS — Z885 Allergy status to narcotic agent status: Secondary | ICD-10-CM | POA: Diagnosis not present

## 2021-01-16 DIAGNOSIS — Z3A39 39 weeks gestation of pregnancy: Secondary | ICD-10-CM

## 2021-01-16 DIAGNOSIS — Z8759 Personal history of other complications of pregnancy, childbirth and the puerperium: Secondary | ICD-10-CM

## 2021-01-16 DIAGNOSIS — O26893 Other specified pregnancy related conditions, third trimester: Secondary | ICD-10-CM | POA: Diagnosis present

## 2021-01-16 DIAGNOSIS — O99334 Smoking (tobacco) complicating childbirth: Secondary | ICD-10-CM | POA: Diagnosis present

## 2021-01-16 DIAGNOSIS — F17210 Nicotine dependence, cigarettes, uncomplicated: Secondary | ICD-10-CM | POA: Diagnosis present

## 2021-01-16 LAB — TYPE AND SCREEN
ABO/RH(D): O POS
Antibody Screen: NEGATIVE

## 2021-01-16 LAB — CBC
HCT: 33.5 % — ABNORMAL LOW (ref 36.0–46.0)
Hemoglobin: 11.1 g/dL — ABNORMAL LOW (ref 12.0–15.0)
MCH: 28.5 pg (ref 26.0–34.0)
MCHC: 33.1 g/dL (ref 30.0–36.0)
MCV: 86.1 fL (ref 80.0–100.0)
Platelets: 306 10*3/uL (ref 150–400)
RBC: 3.89 MIL/uL (ref 3.87–5.11)
RDW: 13.3 % (ref 11.5–15.5)
WBC: 7.9 10*3/uL (ref 4.0–10.5)
nRBC: 0 % (ref 0.0–0.2)

## 2021-01-16 MED ORDER — TERBUTALINE SULFATE 1 MG/ML IJ SOLN: 0.2500 mg | Freq: Once | INTRAMUSCULAR | Status: DC | PRN

## 2021-01-16 MED ORDER — ONDANSETRON HCL 4 MG/2ML IJ SOLN
4.0000 mg | Freq: Four times a day (QID) | INTRAMUSCULAR | Status: DC | PRN
  Administered 2021-01-17: 4 mg via INTRAVENOUS
  Filled 2021-01-16: qty 2

## 2021-01-16 MED ORDER — OXYTOCIN-SODIUM CHLORIDE 30-0.9 UT/500ML-% IV SOLN
1.0000 m[IU]/min | INTRAVENOUS | Status: DC
  Administered 2021-01-16 – 2021-01-17 (×2): 2 m[IU]/min via INTRAVENOUS
  Filled 2021-01-16 (×2): qty 500

## 2021-01-16 MED ORDER — SODIUM CHLORIDE 0.9 % IV SOLN
5.0000 10*6.[IU] | Freq: Once | INTRAVENOUS | Status: AC
  Administered 2021-01-16: 5 10*6.[IU] via INTRAVENOUS
  Filled 2021-01-16: qty 5

## 2021-01-16 MED ORDER — PENICILLIN G POT IN DEXTROSE 60000 UNIT/ML IV SOLN
3.0000 10*6.[IU] | INTRAVENOUS | Status: DC
Start: 2021-01-16 — End: 2021-01-17
  Administered 2021-01-16 – 2021-01-17 (×6): 3 10*6.[IU] via INTRAVENOUS
  Filled 2021-01-16 (×6): qty 50

## 2021-01-16 MED ORDER — LACTATED RINGERS IV SOLN: INTRAVENOUS | Status: DC

## 2021-01-16 MED ORDER — ACETAMINOPHEN 325 MG PO TABS: 650.0000 mg | ORAL_TABLET | ORAL | Status: DC | PRN

## 2021-01-16 MED ORDER — FENTANYL CITRATE (PF) 100 MCG/2ML IJ SOLN
50.0000 ug | INTRAMUSCULAR | Status: DC | PRN
  Administered 2021-01-17 (×3): 100 ug via INTRAVENOUS
  Filled 2021-01-16 (×3): qty 2

## 2021-01-16 MED ORDER — OXYTOCIN BOLUS FROM INFUSION
333.0000 mL | Freq: Once | INTRAVENOUS | Status: AC
  Administered 2021-01-17: 333 mL via INTRAVENOUS

## 2021-01-16 MED ORDER — LACTATED RINGERS IV SOLN
500.0000 mL | INTRAVENOUS | Status: DC | PRN
  Administered 2021-01-17: 1000 mL via INTRAVENOUS

## 2021-01-16 MED ORDER — LIDOCAINE HCL (PF) 1 % IJ SOLN: 30.0000 mL | INTRAMUSCULAR | Status: DC | PRN

## 2021-01-16 MED ORDER — SOD CITRATE-CITRIC ACID 500-334 MG/5ML PO SOLN: 30.0000 mL | ORAL | Status: DC | PRN

## 2021-01-16 MED ORDER — OXYTOCIN-SODIUM CHLORIDE 30-0.9 UT/500ML-% IV SOLN: 2.5000 [IU]/h | INTRAVENOUS | Status: DC

## 2021-01-16 NOTE — H&P (Signed)
Bailey Blair is a 30 y.o. female [id number] 108w0d presenting for scheduled IOL for h/o IUFD. She reports no LOF, VB, Contractions. Normal FM.   Pregnancy c/b: History of IUFD around 32 weeks with severe preeclampsia, suspected abruption. Has been on ASA throughout pregnancy and followed by MFM for weekly BPPs since 31 weeks. Last EFW 33% on 7/12  OB History     Gravida  6   Para  4   Term  3   Preterm  1   AB  1   Living  3      SAB  1   IAB      Ectopic      Multiple      Live Births  3          Past Medical History:  Diagnosis Date   Medical history non-contributory    Pregnancy induced hypertension    Past Surgical History:  Procedure Laterality Date   NO PAST SURGERIES     Family History: family history includes Hypertension in her maternal grandmother. Social History:  reports that she has been smoking cigarettes. She has never used smokeless tobacco. She reports current alcohol use. She reports that she does not currently use drugs after having used the following drugs: Marijuana.     Maternal Diabetes: No Genetic Screening: Normal Maternal Ultrasounds/Referrals: none Fetal Ultrasounds or other Referrals:  Referred to Materal Fetal Medicine  for h/o IUFD, h/o severe preeclampsia. Had weekly BPP starting at 31 weeks.  Maternal Substance Abuse:  No Significant Maternal Medications:  None Significant Maternal Lab Results:  Group B Strep positive Other Comments:  None  Review of Systems Per HPI Exam Physical Exam  Dilation: Fingertip Effacement (%): Thick Station: -3, Ballotable Exam by:: [employer] Rn Blood pressure 114/72, pulse 82, temperature 98.4 F (36.9 C), temperature source Oral, resp. rate 18, height 5\' 3"  (1.6 m), weight 72.6 kg, last menstrual period 04/18/2020, SpO2 100 %. Gen: NAD, resting comfortably CVS: RRR Lungs: nonlabored breathing Abd: Gravid abdomen, leopolds 7# Ext: no calf edema or tenderness  Fetal testing: 145bpm,  moderate variability, + accels, no decels Toco: irregular Prenatal labs: ABO, Rh:  --/--/O POS (08/20 1617) Antibody: NEG (08/20 1617) Rubella: Immune (02/07 0000) RPR: Nonreactive (02/07 0000)  HBsAg: Negative (02/07 0000)  HIV: Non-reactive (02/07 0000)  GBS: Positive/-- (08/08 0000)   Assessment/Plan: 02-26-1992 @ [redacted]w[redacted]d, IOL for h/o IUFD Fetal wellbeing: cat I tracing IOL: on pitocin, plan AROM when appropriate and at least 4 hours after penicillin Pain control: epidural upon patient request  [redacted]w[redacted]d 01/16/2021, 8:13 PM

## 2021-01-17 ENCOUNTER — Encounter (HOSPITAL_COMMUNITY): Payer: Self-pay | Admitting: Obstetrics and Gynecology

## 2021-01-17 ENCOUNTER — Inpatient Hospital Stay (HOSPITAL_COMMUNITY): Payer: Medicaid Other | Admitting: Anesthesiology

## 2021-01-17 LAB — RPR: RPR Ser Ql: NONREACTIVE

## 2021-01-17 MED ORDER — PRENATAL MULTIVITAMIN CH
1.0000 | ORAL_TABLET | Freq: Every day | ORAL | Status: DC
  Administered 2021-01-18 – 2021-01-19 (×2): 1 via ORAL
  Filled 2021-01-17 (×2): qty 1

## 2021-01-17 MED ORDER — LIDOCAINE HCL (PF) 1 % IJ SOLN
INTRAMUSCULAR | Status: DC | PRN
  Administered 2021-01-17: 10 mL via EPIDURAL

## 2021-01-17 MED ORDER — EPHEDRINE 5 MG/ML INJ: 10.0000 mg | INTRAVENOUS | Status: DC | PRN

## 2021-01-17 MED ORDER — DOCUSATE SODIUM 100 MG PO CAPS
100.0000 mg | ORAL_CAPSULE | Freq: Two times a day (BID) | ORAL | Status: DC
  Administered 2021-01-18 (×2): 100 mg via ORAL
  Filled 2021-01-17 (×2): qty 1

## 2021-01-17 MED ORDER — WITCH HAZEL-GLYCERIN EX PADS: 1.0000 "application " | MEDICATED_PAD | CUTANEOUS | Status: DC | PRN

## 2021-01-17 MED ORDER — LACTATED RINGERS IV SOLN: 500.0000 mL | Freq: Once | INTRAVENOUS | Status: DC

## 2021-01-17 MED ORDER — PHENYLEPHRINE 40 MCG/ML (10ML) SYRINGE FOR IV PUSH (FOR BLOOD PRESSURE SUPPORT): 80.0000 ug | PREFILLED_SYRINGE | INTRAVENOUS | Status: DC | PRN

## 2021-01-17 MED ORDER — DIPHENHYDRAMINE HCL 25 MG PO CAPS: 25.0000 mg | ORAL_CAPSULE | Freq: Four times a day (QID) | ORAL | Status: DC | PRN

## 2021-01-17 MED ORDER — ACETAMINOPHEN 325 MG PO TABS
650.0000 mg | ORAL_TABLET | ORAL | Status: DC | PRN
  Administered 2021-01-18 (×2): 650 mg via ORAL
  Filled 2021-01-17 (×2): qty 2

## 2021-01-17 MED ORDER — ONDANSETRON HCL 4 MG/2ML IJ SOLN: 4.0000 mg | INTRAMUSCULAR | Status: DC | PRN

## 2021-01-17 MED ORDER — SIMETHICONE 80 MG PO CHEW: 80.0000 mg | CHEWABLE_TABLET | ORAL | Status: DC | PRN

## 2021-01-17 MED ORDER — COCONUT OIL OIL: 1.0000 "application " | TOPICAL_OIL | Status: DC | PRN

## 2021-01-17 MED ORDER — FLEET ENEMA 7-19 GM/118ML RE ENEM: 1.0000 | ENEMA | Freq: Every day | RECTAL | Status: DC | PRN

## 2021-01-17 MED ORDER — TETANUS-DIPHTH-ACELL PERTUSSIS 5-2.5-18.5 LF-MCG/0.5 IM SUSY: 0.5000 mL | PREFILLED_SYRINGE | Freq: Once | INTRAMUSCULAR | Status: DC

## 2021-01-17 MED ORDER — DIPHENHYDRAMINE HCL 50 MG/ML IJ SOLN: 12.5000 mg | INTRAMUSCULAR | Status: DC | PRN

## 2021-01-17 MED ORDER — BENZOCAINE-MENTHOL 20-0.5 % EX AERO
1.0000 "application " | INHALATION_SPRAY | CUTANEOUS | Status: DC | PRN
  Filled 2021-01-17: qty 56

## 2021-01-17 MED ORDER — FENTANYL-BUPIVACAINE-NACL 0.5-0.125-0.9 MG/250ML-% EP SOLN
12.0000 mL/h | EPIDURAL | Status: DC | PRN
  Administered 2021-01-17: 12 mL/h via EPIDURAL
  Filled 2021-01-17: qty 250

## 2021-01-17 MED ORDER — MISOPROSTOL 25 MCG QUARTER TABLET
25.0000 ug | ORAL_TABLET | ORAL | Status: DC | PRN
  Administered 2021-01-17: 25 ug via VAGINAL
  Filled 2021-01-17: qty 1

## 2021-01-17 MED ORDER — DIBUCAINE (PERIANAL) 1 % EX OINT: 1.0000 "application " | TOPICAL_OINTMENT | CUTANEOUS | Status: DC | PRN

## 2021-01-17 MED ORDER — IBUPROFEN 600 MG PO TABS
600.0000 mg | ORAL_TABLET | Freq: Four times a day (QID) | ORAL | Status: DC
  Administered 2021-01-17 – 2021-01-19 (×7): 600 mg via ORAL
  Filled 2021-01-17 (×7): qty 1

## 2021-01-17 MED ORDER — BISACODYL 10 MG RE SUPP: 10.0000 mg | Freq: Every day | RECTAL | Status: DC | PRN

## 2021-01-17 MED ORDER — ONDANSETRON HCL 4 MG PO TABS: 4.0000 mg | ORAL_TABLET | ORAL | Status: DC | PRN

## 2021-01-17 NOTE — Progress Notes (Signed)
OB Progress Note  S: pt frustrated by lack of progress, feeling mild contractions   O: Today's Vitals   01/17/21 0740 01/17/21 0930 01/17/21 0950 01/17/21 1100  BP: 109/74  112/65 112/78  Pulse: 86  89 80  Resp: 16  16 16   Temp: 97.7 F (36.5 C)     TempSrc: Axillary     SpO2:      Weight:      Height:      PainSc: 2  Asleep  0-No pain   Body mass index is 28.36 kg/m.  SVE 1.5/50/-3/posterior, Cook catheter placed 40 intrauterine/40 vaginal  FHR: 145bpm, moderate variability, + accels, occ small variable decels Toco: ctx q 5 min   A/P: 30Y 04-10-1980 @ [redacted]w[redacted]d, IOL for h/o IUFD Fetal wellbeing: cat 2 tracing currently, overall reassuring IOL: no progress despite cytotec and pitocin overnight. Patient now amenable to balloon, Cook catheter placed 40/40, will plan to increase intrauterine balloon to 80 over the next hour.  Pain control: epidural upon patient request  M. [redacted]w[redacted]d, MD 01/17/21 11:23 AM

## 2021-01-17 NOTE — Anesthesia Procedure Notes (Signed)
Epidural Patient location during procedure: OB Start time: 01/17/2021 5:30 PM End time: 01/17/2021 5:35 PM  Staffing Anesthesiologist: Leilani Able, MD Performed: anesthesiologist   Preanesthetic Checklist Completed: patient identified, IV checked, site marked, risks and benefits discussed, surgical consent, monitors and equipment checked, pre-op evaluation and timeout performed  Epidural Patient position: sitting Prep: DuraPrep and site prepped and draped Patient monitoring: continuous pulse ox and blood pressure Approach: midline Location: L3-L4 Injection technique: LOR air  Needle:  Needle type: Tuohy  Needle gauge: 17 G Needle length: 9 cm and 9 Needle insertion depth: 8 cm Catheter type: closed end flexible Catheter size: 19 Gauge Catheter at skin depth: 13 cm Test dose: negative and Other  Assessment Events: blood not aspirated, injection not painful, no injection resistance, no paresthesia and negative IV test  Additional Notes Reason for block:procedure for pain

## 2021-01-17 NOTE — Anesthesia Preprocedure Evaluation (Signed)
Anesthesia Evaluation  Patient identified by MRN, date of birth, ID band Patient awake    Reviewed: Allergy & Precautions, H&P , Patient's Chart, lab work & pertinent test results  Airway Mallampati: I  TM Distance: >3 FB Neck ROM: full    Dental no notable dental hx.    Pulmonary neg pulmonary ROS, Current Smoker,    Pulmonary exam normal breath sounds clear to auscultation       Cardiovascular hypertension, negative cardio ROS Normal cardiovascular exam Rhythm:regular Rate:Normal     Neuro/Psych negative neurological ROS  negative psych ROS   GI/Hepatic negative GI ROS, Neg liver ROS,   Endo/Other  negative endocrine ROS  Renal/GU negative Renal ROS  negative genitourinary   Musculoskeletal negative musculoskeletal ROS (+)   Abdominal Normal abdominal exam  (+)   Peds  Hematology negative hematology ROS (+) anemia ,   Anesthesia Other Findings   Reproductive/Obstetrics (+) Pregnancy                             Anesthesia Physical Anesthesia Plan  ASA: 2  Anesthesia Plan: Epidural   Post-op Pain Management:    Induction:   PONV Risk Score and Plan:   Airway Management Planned:   Additional Equipment:   Intra-op Plan:   Post-operative Plan:   Informed Consent: I have reviewed the patients History and Physical, chart, labs and discussed the procedure including the risks, benefits and alternatives for the proposed anesthesia with the patient or authorized representative who has indicated his/her understanding and acceptance.       Plan Discussed with:   Anesthesia Plan Comments:         Anesthesia Quick Evaluation

## 2021-01-17 NOTE — Progress Notes (Signed)
OB Progress Note  Notified by RN that [facility] catheter fell out.   S: Patient feeling tired, discomfort with contractions controlled by IV fentanyl   O: Today's Vitals   01/17/21 1500 01/17/21 1530 01/17/21 1600 01/17/21 1604  BP:  103/65 108/84   Pulse:  75 69   Resp:  18 18   Temp:   (!) 97.5 F (36.4 C)   TempSrc:   Oral   SpO2:      Weight:      Height:      PainSc: Asleep   7    Body mass index is 28.36 kg/m.  SVE 4.5/50/-1/posterior, AROM clear blood tinged fluid  FHR: 145bpm, minimal to moderate variability, no accels, early decels Toco:ctx q 2-3 mins   A/P:  4O [id number] @ [redacted]w[redacted]d, IOL for h/o IUFD Fetal wellbeing: cat 1-2 tracing, overall reassuring IOL: s/p cytotec, s/p Cook, s/p AROM, continue pitocin Pain control: patient now requesting epidural  M. Timothy Lasso, MD 01/17/21 4:09 PM

## 2021-01-18 LAB — CBC
HCT: 30.4 % — ABNORMAL LOW (ref 36.0–46.0)
Hemoglobin: 10 g/dL — ABNORMAL LOW (ref 12.0–15.0)
MCH: 28.6 pg (ref 26.0–34.0)
MCHC: 32.9 g/dL (ref 30.0–36.0)
MCV: 86.9 fL (ref 80.0–100.0)
Platelets: 239 10*3/uL (ref 150–400)
RBC: 3.5 MIL/uL — ABNORMAL LOW (ref 3.87–5.11)
RDW: 13.3 % (ref 11.5–15.5)
WBC: 11.4 10*3/uL — ABNORMAL HIGH (ref 4.0–10.5)
nRBC: 0 % (ref 0.0–0.2)

## 2021-01-18 MED ORDER — OXYCODONE HCL 5 MG PO TABS
5.0000 mg | ORAL_TABLET | ORAL | Status: DC | PRN
  Administered 2021-01-18 (×2): 5 mg via ORAL
  Filled 2021-01-18 (×2): qty 1

## 2021-01-18 NOTE — Progress Notes (Signed)
Pt rates abdominal cramping pain 9 out of 10 on number pain scale. Scheduled motrin and PRN Tylenol given; did not provide relief. Informed Dr. Claiborne Billings of happenings; received verbal order for 5 mg oxycodone Q 6 PRN. Pt has allergy to codeine that causes mouth swelling. This RN asked pt multiple times if she has taken oxycodone in the past without problems and pt verbalized that she has.

## 2021-01-18 NOTE — Anesthesia Postprocedure Evaluation (Signed)
Anesthesia Post Note  Patient: Bailey Blair  Procedure(s) Performed: AN AD HOC LABOR EPIDURAL     Patient location during evaluation: Mother Baby Anesthesia Type: Epidural Level of consciousness: awake and alert Pain management: pain level controlled Vital Signs Assessment: post-procedure vital signs reviewed and stable Respiratory status: spontaneous breathing, nonlabored ventilation and respiratory function stable Cardiovascular status: stable Postop Assessment: no headache, no backache and epidural receding Anesthetic complications: no   No notable events documented.  Last Vitals:  Vitals:   01/17/21 2331 01/18/21 0316  BP: 109/74 107/72  Pulse: 96 77  Resp: 16 16  Temp:  36.7 C  SpO2: 100% 100%    Last Pain:  Vitals:   01/18/21 0547  TempSrc:   PainSc: 4    Pain Goal: Patients Stated Pain Goal: 0 (01/17/21 0000)                 Emmaline Kluver N

## 2021-01-18 NOTE — Social Work (Signed)
CSW consulted for "MOB admits Alcohol use during current pregnancy, history of IUFD at 32 weeks with last baby".   CSW met with MOB to provide support and resources. CSW introduced self and role. CSW observed MOB holding infant '[place]". CSW informed MOB of the reason for consult and assessed current feelings. MOB reported she is currently doing well and had a good pregnancy. MOB stated she did not use alcohol during pregnancy. MOB reported "I don't even like alcohol". CSW expressed understanding and apologized for the discrepancy. CSW did not feel a need to address the trauma of an UFD during the last pregnancy.  MOB denies any mental health history. MOB identified a support system consisting of her mother and FOB. MOB denies any current SI, HI or being involved in DV. MOB was pleasant and appropriate throughout assessment.   CSW provided education regarding the baby blues period versus PPD and provided resources. CSW provided the New Mom Checklist and encouraged MOB to self evaluate and contact a medical professional if symptoms are noted at any time.   CSW provided review of Sudden Infant Death Syndrome (SIDS) precautions. MOB reported she has all infant needs, including a crib. MOB denies any barriers to follow-up care. MOB denied having any additional needs at this time.    CSW identifies no further need for intervention and no barriers to discharge at this time.  Chaney Johnson, LCSWA Clinical Social Work [facility] [phone] 

## 2021-01-18 NOTE — Progress Notes (Signed)
Patient is eating, ambulating, voiding.  Pain control is good.  Appropriate lochia, no complaints.  Vitals:   01/17/21 2242 01/17/21 2331 01/18/21 0316 01/18/21 0851  BP: 102/76 109/74 107/72 107/69  Pulse: 70 96 77 73  Resp: 18 16 16 18   Temp: 98.6 F (37 C)  98 F (36.7 C) 98 F (36.7 C)  TempSrc: Oral Oral Oral Oral  SpO2: 99% 100% 100% 99%  Weight:      Height:        Fundus firm Ext: no calf tenderness  Lab Results  Component Value Date   WBC 11.4 (H) 01/18/2021   HGB 10.0 (L) 01/18/2021   HCT 30.4 (L) 01/18/2021   MCV 86.9 01/18/2021   PLT 239 01/18/2021    --/--/O POS (08/20 1617)  A/P Post partum day 1 H.o severe PEC prior pregnancy, BPs remain normal range  Routine care.  Expect d/c tomorrow.  05-04-2002

## 2021-01-19 NOTE — Progress Notes (Signed)
Patient is eating, ambulating, voiding.  Pain control is good.  Vitals:   01/18/21 1234 01/18/21 1630 01/18/21 2238 01/19/21 0628  BP: 121/84 109/77 107/73 104/72  Pulse: 63 67 66 71  Resp: 19 18 18 18   Temp: 98.5 F (36.9 C) 98.5 F (36.9 C) 98.2 F (36.8 C) 98 F (36.7 C)  TempSrc: Oral Oral Oral Oral  SpO2: 100%     Weight:      Height:        Fundus firm Perineum without swelling.  Lab Results  Component Value Date   WBC 11.4 (H) 01/18/2021   HGB 10.0 (L) 01/18/2021   HCT 30.4 (L) 01/18/2021   MCV 86.9 01/18/2021   PLT 239 01/18/2021    --/--/O POS (08/20 1617)/RI  A/P Post partum day 2.  BPs stable and normal.  Routine care.  Expect d/c today.    05-04-2002

## 2021-01-19 NOTE — Discharge Summary (Signed)
Postpartum Discharge Summary  Date of Service updated      Patient Name: Bailey Blair DOB: 1990/07/31 MRN: [id number]  Date of admission: 01/16/2021 Delivery date:01/17/2021  Delivering provider: Irene Pap E  Date of discharge: 01/19/2021  Admitting diagnosis: History of IUFD [Z87.59] Intrauterine pregnancy: [redacted]w[redacted]d    Secondary diagnosis:  Active Problems:   History of IUFD  Additional problems: none    Discharge diagnosis: Term Pregnancy Delivered                                              Post partum procedures: none Augmentation: AROM and Pitocin Complications: None  Hospital course: Induction of Labor With Vaginal Delivery   30y.o. yo [id number] 296w1das admitted to the hospital 01/16/2021 for induction of labor.  Indication for induction:  Hx IUFD .  Patient had an uncomplicated labor course as follows: Membrane Rupture Time/Date: 4:01 PM ,01/17/2021   Delivery Method:Vaginal, Spontaneous  Episiotomy: None  Lacerations:  None  Details of delivery can be found in separate delivery note.  Patient had a routine postpartum course. Patient is discharged home 01/19/21.  Newborn Data: Birth date:01/17/2021  Birth time:8:50 PM  Gender:Female  Living status:Living  Apgars:8 ,9  Weight:2702 g   Magnesium Sulfate received: No BMZ received: No Rhophylac:No MMR:No T-DaP:Given prenatally Flu: No Transfusion:No  Physical exam  Vitals:   01/18/21 1234 01/18/21 1630 01/18/21 2238 01/19/21 0628  BP: 121/84 109/77 107/73 104/72  Pulse: 63 67 66 71  Resp: 19 18 18 18   Temp: 98.5 F (36.9 C) 98.5 F (36.9 C) 98.2 F (36.8 C) 98 F (36.7 C)  TempSrc: Oral Oral Oral Oral  SpO2: 100%     Weight:      Height:        Labs: Lab Results  Component Value Date   WBC 11.4 (H) 01/18/2021   HGB 10.0 (L) 01/18/2021   HCT 30.4 (L) 01/18/2021   MCV 86.9 01/18/2021   PLT 239 01/18/2021   CMP Latest Ref Rng & Units 12/09/2019  Glucose 70 - 99 mg/dL 87  BUN 6 -  20 mg/dL 6  Creatinine 0.44 - 1.00 mg/dL 0.76  Sodium 135 - 145 mmol/L 139  Potassium 3.5 - 5.1 mmol/L 4.0  Chloride 98 - 111 mmol/L 105  CO2 22 - 32 mmol/L 26  Calcium 8.9 - 10.3 mg/dL 9.1   Edinburgh Score: No flowsheet data found.    After visit meds:  Allergies as of 01/19/2021       Reactions   Codeine Swelling   Pt has tolerated oxycodone in the past        Medication List     STOP taking these medications    aspirin EC 81 MG tablet       TAKE these medications    prenatal multivitamin Tabs tablet Take 1 tablet by mouth daily at 12 noon.               Discharge Care Instructions  (From admission, onward)           Start     Ordered   01/19/21 0000  Discharge wound care:       Comments: Sitz baths and icepacks to perineum.  If stitches, they will dissolve.   01/19/21 [id number]  Discharge home in stable condition Infant Feeding:  ? Infant Disposition:home with mother Discharge instruction: per After Visit Summary and Postpartum booklet. Activity: Advance as tolerated. Pelvic rest for 6 weeks.  Diet: routine diet Anticipated Birth Control: Unsure Postpartum Appointment:4 weeks Additional Postpartum F/U:  none Future Appointments:No future appointments. Follow up Visit:  Follow-up Information     Rowland Lathe, MD Follow up in 4 week(s).   Specialty: Obstetrics and Gynecology Contact information: [address] [place] [address] [phone]                     01/19/2021 Daria Pastures, MD

## 2021-01-19 NOTE — Progress Notes (Signed)
Patient has no complaints at this time.  

## 2021-01-23 NOTE — MAU Provider Note (Signed)
History     CSN: [id number]  Arrival date and time: 01/02/21 1101   Event Date/Time   First Provider Initiated Contact with Patient 01/02/21 1145      Chief Complaint  Patient presents with   Emesis   Diarrhea   HPI Bailey Blair is a 30 y.o. [id number] who presents with vomiting and diarrhea. She reports it started last night and one time she saw blood in the vomit. She has had no episodes of vomiting today. She also reports several loose stools last night, none today. She reports all of the symptoms started after she ate [employer] at 2300. She denies any leaking or bleeding. Reports normal fetal movement.  OB History     Gravida  6   Para  5   Term  4   Preterm  1   AB  1   Living  4      SAB  1   IAB      Ectopic      Multiple  0   Live Births  4           Past Medical History:  Diagnosis Date   Medical history non-contributory    Pregnancy induced hypertension     Past Surgical History:  Procedure Laterality Date   NO PAST SURGERIES      Family History  Problem Relation Age of Onset   Hypertension Maternal Grandmother     Social History   Tobacco Use   Smoking status: Some Days    Types: Cigarettes   Smokeless tobacco: Never  Vaping Use   Vaping Use: Never used  Substance Use Topics   Alcohol use: Yes   Drug use: Not Currently    Types: Marijuana    Allergies:  Allergies  Allergen Reactions   Codeine Swelling    Pt has tolerated oxycodone in the past    No medications prior to admission.    Review of Systems  Constitutional: Negative.  Negative for fatigue and fever.  HENT: Negative.    Respiratory: Negative.  Negative for shortness of breath.   Cardiovascular: Negative.  Negative for chest pain.  Gastrointestinal:  Positive for diarrhea, nausea and vomiting. Negative for abdominal pain and constipation.  Genitourinary: Negative.  Negative for dysuria, vaginal bleeding and vaginal discharge.  Neurological: Negative.   Negative for dizziness and headaches.  Physical Exam   Blood pressure 108/70, pulse 84, temperature 98.3 F (36.8 C), temperature source Oral, resp. rate 15, height 5\' 3"  (1.6 m), weight 71.4 kg, last menstrual period 04/18/2020, SpO2 100 %, unknown if currently breastfeeding.  Physical Exam Vitals and nursing note reviewed.  Constitutional:      General: She is not in acute distress.    Appearance: She is well-developed.  HENT:     Head: Normocephalic.  Eyes:     Pupils: Pupils are equal, round, and reactive to light.  Cardiovascular:     Rate and Rhythm: Normal rate and regular rhythm.     Heart sounds: Normal heart sounds.  Pulmonary:     Effort: Pulmonary effort is normal. No respiratory distress.     Breath sounds: Normal breath sounds.  Abdominal:     General: Bowel sounds are normal. There is no distension.     Palpations: Abdomen is soft.     Tenderness: There is no abdominal tenderness.  Skin:    General: Skin is warm and dry.  Neurological:     Mental Status: She is alert and  oriented to person, place, and time.  Psychiatric:        Mood and Affect: Mood normal.        Behavior: Behavior normal.        Thought Content: Thought content normal.        Judgment: Judgment normal.   Fetal Tracing:  Baseline: 140 Variability: moderate Accels: 15x15 Decels: none  Toco: none   MAU Course  Procedures Results for Bailey Blair, Bailey Blair (MRN [id number]) as of 01/23/2021 14:54  Ref. Range 01/02/2021 11:45  URINALYSIS, ROUTINE W REFLEX MICROSCOPIC Unknown Rpt (A)  Appearance Latest Ref Range: CLEAR  HAZY (A)  Bilirubin Urine Latest Ref Range: NEGATIVE  NEGATIVE  Color, Urine Latest Ref Range: YELLOW  YELLOW  Glucose, UA Latest Ref Range: NEGATIVE mg/dL NEGATIVE  Hgb urine dipstick Latest Ref Range: NEGATIVE  NEGATIVE  Ketones, ur Latest Ref Range: NEGATIVE mg/dL NEGATIVE  Leukocytes,Ua Latest Ref Range: NEGATIVE  NEGATIVE  Nitrite Latest Ref Range: NEGATIVE  NEGATIVE  pH  Latest Ref Range: 5.0 - 8.0  9.0 (H)  Protein Latest Ref Range: NEGATIVE mg/dL NEGATIVE  Specific Gravity, Urine Latest Ref Range: 1.005 - 1.030  1.011   MDM UA- no signs of dehydration No episodes of vomiting or diarrhea in MAU, resting comfortably Patient declines medication, states she was mostly worried about the blood. Discussed warning signs for blood in vomit and when to return  Assessment and Plan   1. Nausea/vomiting in pregnancy   2. [redacted] weeks gestation of pregnancy    -Discharge home in stable condition -Vomiting precautions discussed -Patient advised to follow-up with OB as scheduled for prenatal care -Patient may return to MAU as needed or if her condition were to change or worsen   Rolm Bookbinder CNM

## 2021-01-27 ENCOUNTER — Telehealth (HOSPITAL_COMMUNITY): Payer: Self-pay | Admitting: *Deleted

## 2021-01-27 NOTE — Telephone Encounter (Signed)
Patient voiced no questions or concerns regarding her own health. EPDS = 1. Patient voiced no questions or concerns regarding baby at this time. Patient reported infant sleeps in a bassinet on her back. RN reviewed ABCs of safe sleep - patient verbalized understanding. Patient requested RN email information on hospital's virtual breastfeeding and pumping support groups. Email sent. Deforest Hoyles, RN 01/27/21, [phone].

## 2022-03-15 IMAGING — US US MFM FETAL BPP W/O NON-STRESS
1 series · 15 of 28 positions shown · non-contrast
Comparison: none

[Series 1: us mfm fetal bpp w/o non-stress · 35 acquisitions, 15 frames shown]
[im 1/35]
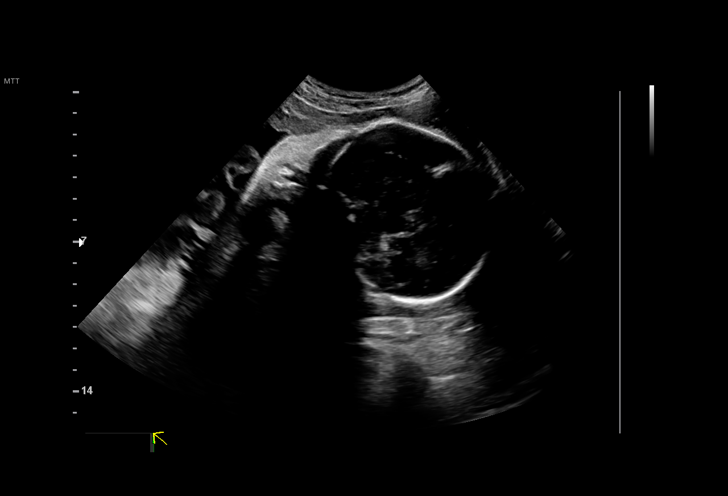
[im 3/35]
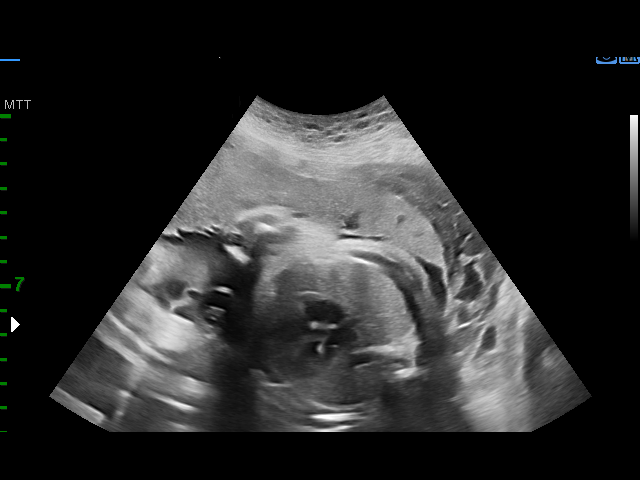
[im 6/35]
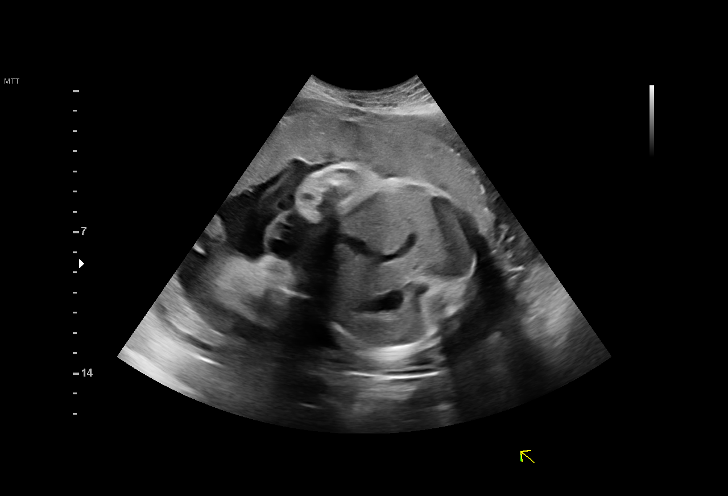
[im 8/35]
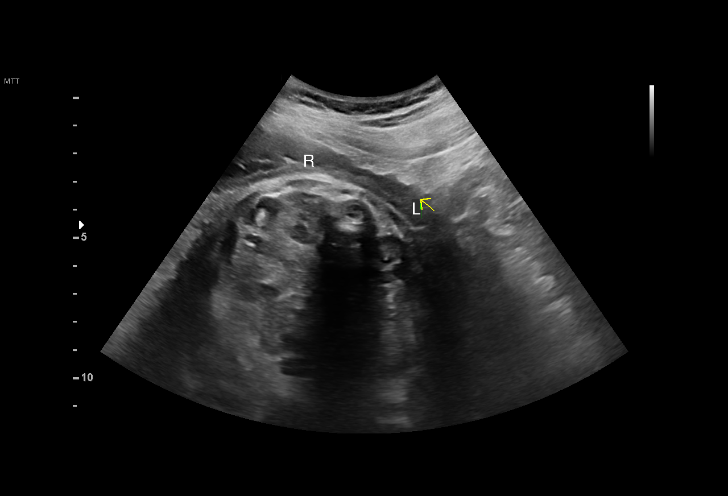
[im 11/35]
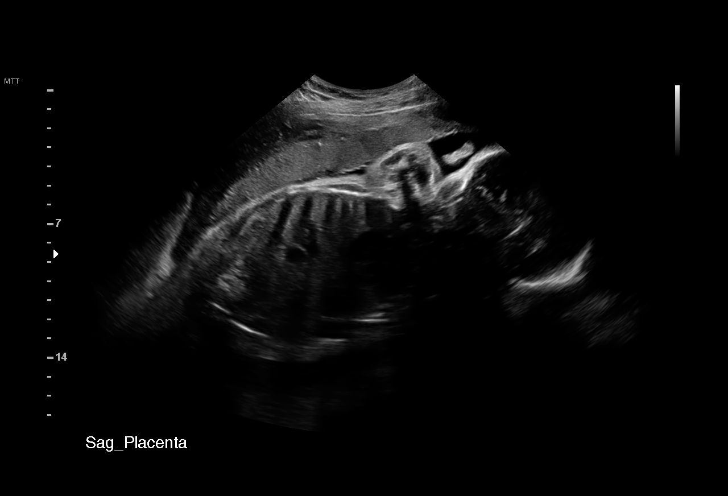
[im 13/35]
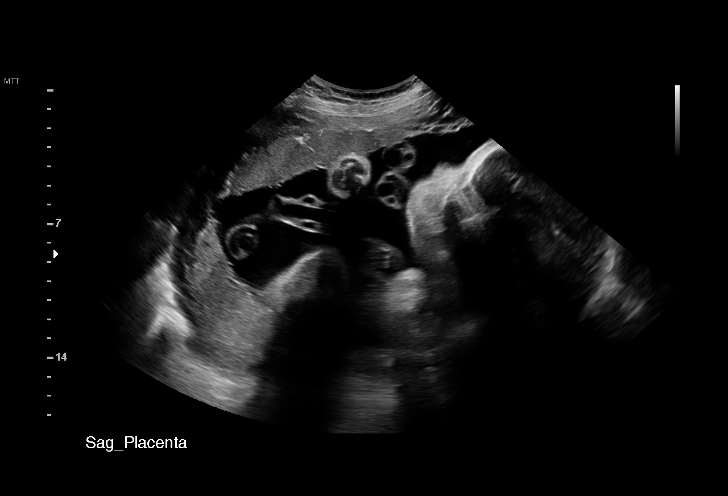
[im 16/35]
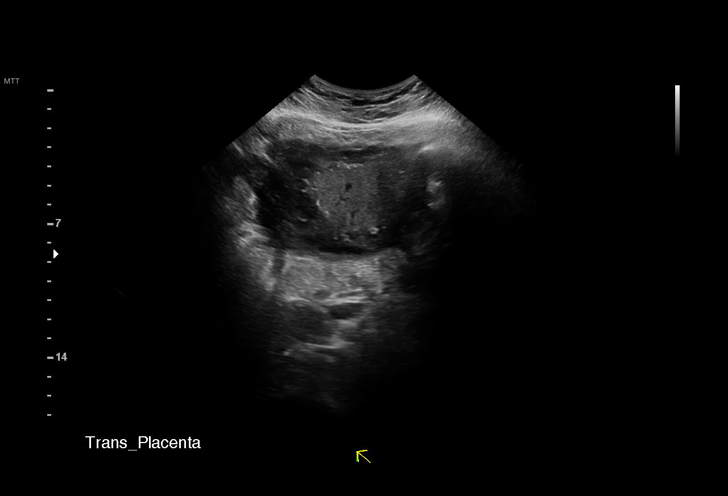
[im 18/35]
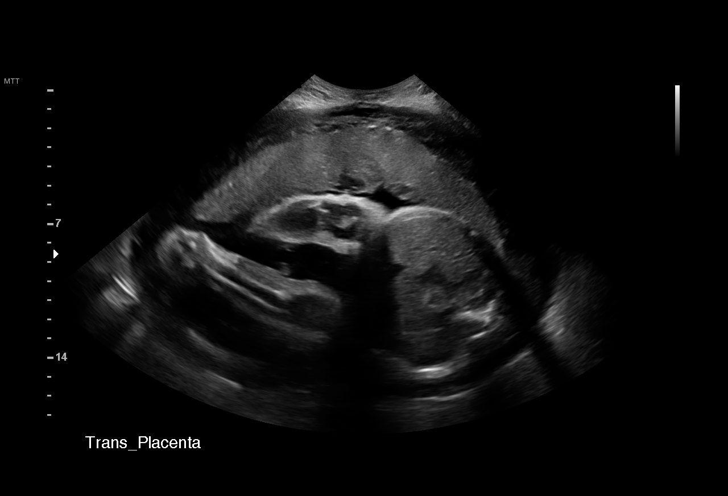
[im 19/35]
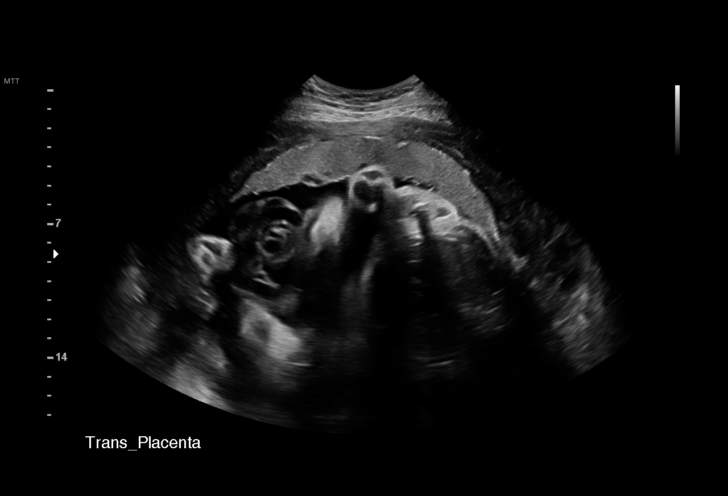
[im 22/35]
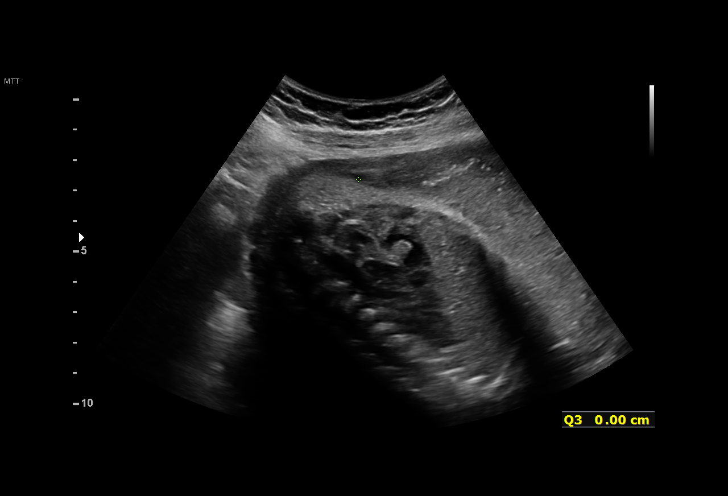
[im 24/35]
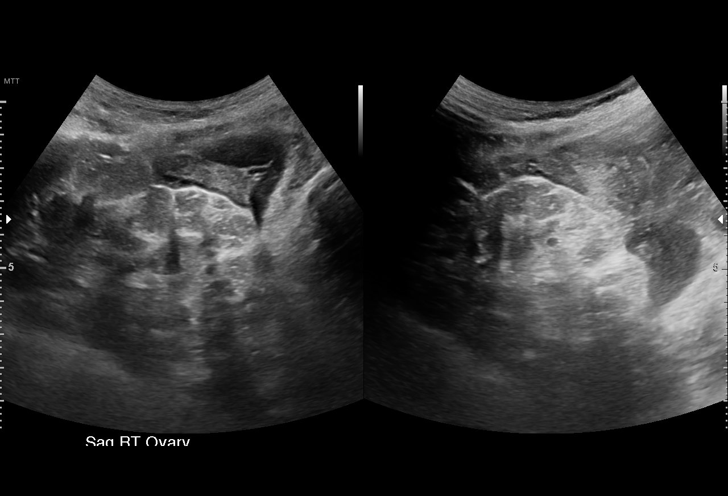
[im 27/35]
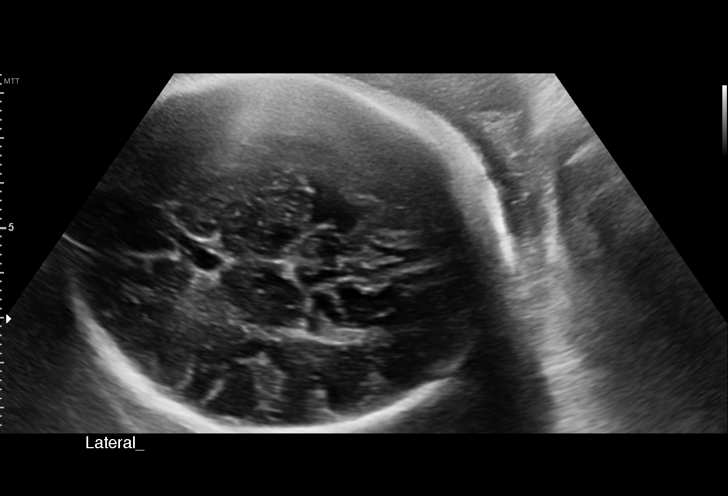
[im 29/35]
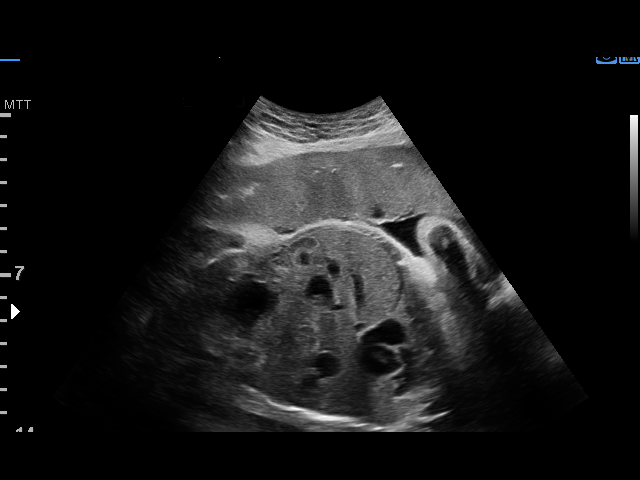
[im 32/35]
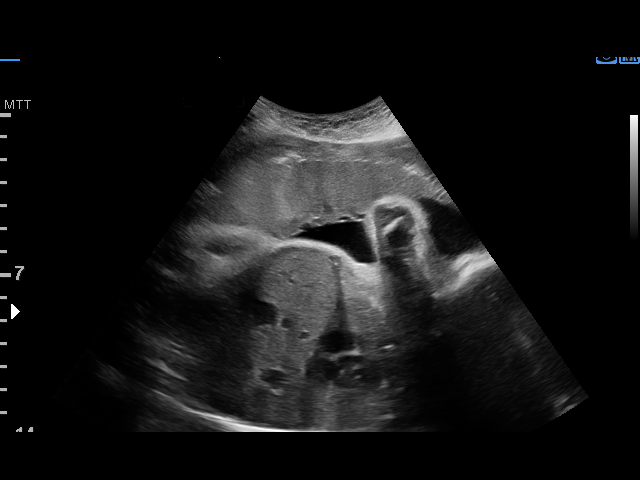
[im 35/35]
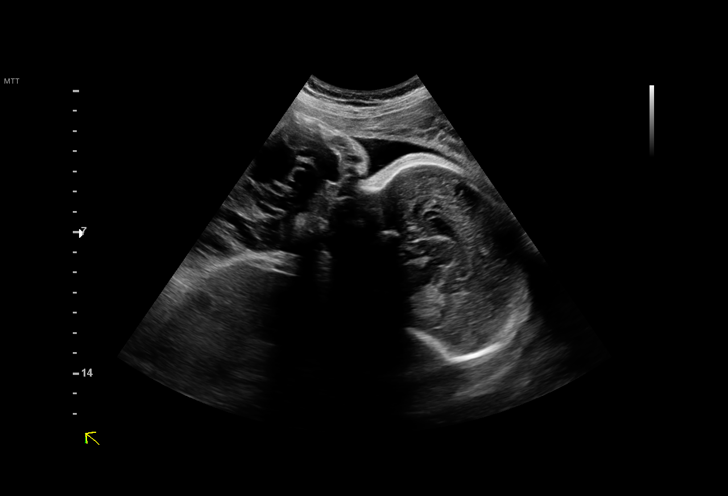

[15 of 28 positions shown; findings below may reference images not displayed]

OBGYN
                                                            [REDACTED]
                   JIM

Indications

 32 weeks gestation of pregnancy
 Poor obstetric history: Previous
 preeclampsia / eclampsia/gestational HTN
 Abnormal ultrasound finding on antenatal
 screening of mother
 Poor obstetric history: Previous IUFD
 (stillbirth) (possible abruption)
 Low risk NIPS
 Negative AFP
 Encounter for other antenatal screening
 follow-up
Fetal Evaluation

 Num Of Fetuses:         1
 Fetal Heart Rate(bpm):  145
 Cardiac Activity:       Observed
 Presentation:           Cephalic
 Placenta:               Anterior Fundal
 P. Cord Insertion:      Previously Visualized

 Amniotic Fluid
 AFI FV:      Within normal limits

 AFI Sum(cm)     %Tile       Largest Pocket(cm)
 12.6            37
 RUQ(cm)       RLQ(cm)       LUQ(cm)        LLQ(cm)
 5.8           2.8           4              0
Biophysical Evaluation

 Amniotic F.V:   Pocket => 2 cm             F. Tone:        Observed
 F. Movement:    Observed                   Score:          [DATE]
 F. Breathing:   Observed
OB History

 Gravidity:    6         Term:   3        Prem:   1        SAB:   1
 Living:       3
Gestational Age

 LMP:           32w 4d        Date:  04/18/20                 EDD:   01/23/21
 Best:          32w 4d     Det. By:  LMP  (04/18/20)          EDD:   01/23/21
Anatomy

 Ventricles:            Appears normal         Stomach:                Appears normal, left
                                                                       sided
 Heart:                 Appears normal         Kidneys:                Appear normal
                        (4CH, axis, and
                        situs)
 Diaphragm:             Appears normal         Bladder:                Appears normal
Cervix Uterus Adnexa

 Cervix
 Not visualized (advanced GA >41wks)

 Uterus
 No abnormality visualized.

 Right Ovary
 Within normal limits.

 Left Ovary
 Within normal limits.

 Cul De Sac
 No free fluid seen.

 Adnexa
 No abnormality visualized.
Impression

 Amniotic fluid is normal and good fetal activity is seen
 .Antenatal testing is reassuring. BPP [DATE].

 History of stillbirth at 32 weeks' gestation. Patient reports she
 does not have gestational diabetes.
 BP at our office: 109/75 mm Hg .
Recommendations

 -Continue weekly BPP till delivery.
 -Fetal growth assessment next week.
                 Pulver, Gezim

## 2023-01-26 ENCOUNTER — Ambulatory Visit: Payer: Medicaid Other | Admitting: Nurse Practitioner
